# Patient Record
Sex: Female | Born: 1955
Health system: Southern US, Community
[De-identification: ages and names within clinical notes are randomized; demographics above are authoritative.]

## PROBLEM LIST (undated history)

## (undated) DIAGNOSIS — R739 Hyperglycemia, unspecified: Secondary | ICD-10-CM

## (undated) DIAGNOSIS — E785 Hyperlipidemia, unspecified: Secondary | ICD-10-CM

## (undated) DIAGNOSIS — M1990 Unspecified osteoarthritis, unspecified site: Secondary | ICD-10-CM

## (undated) DIAGNOSIS — M545 Low back pain, unspecified: Secondary | ICD-10-CM

## (undated) DIAGNOSIS — G8929 Other chronic pain: Secondary | ICD-10-CM

## (undated) DIAGNOSIS — I10 Essential (primary) hypertension: Secondary | ICD-10-CM

## (undated) DIAGNOSIS — B1920 Unspecified viral hepatitis C without hepatic coma: Secondary | ICD-10-CM

## (undated) DIAGNOSIS — F32A Depression, unspecified: Secondary | ICD-10-CM

## (undated) DIAGNOSIS — F419 Anxiety disorder, unspecified: Secondary | ICD-10-CM

## (undated) DIAGNOSIS — J449 Chronic obstructive pulmonary disease, unspecified: Secondary | ICD-10-CM

## (undated) DIAGNOSIS — F329 Major depressive disorder, single episode, unspecified: Secondary | ICD-10-CM

## (undated) HISTORY — DX: Depression, unspecified: F32.A

## (undated) HISTORY — DX: Chronic obstructive pulmonary disease, unspecified: J44.9

## (undated) HISTORY — DX: Hyperglycemia, unspecified: R73.9

## (undated) HISTORY — PX: OTHER SURGICAL HISTORY: SHX169

## (undated) HISTORY — DX: Hyperlipidemia, unspecified: E78.5

## (undated) HISTORY — DX: Unspecified viral hepatitis C without hepatic coma: B19.20

## (undated) HISTORY — DX: Low back pain, unspecified: M54.50

## (undated) HISTORY — DX: Major depressive disorder, single episode, unspecified: F32.9

## (undated) HISTORY — DX: Unspecified osteoarthritis, unspecified site: M19.90

## (undated) HISTORY — DX: Essential (primary) hypertension: I10

## (undated) HISTORY — DX: Other chronic pain: G89.29

## (undated) HISTORY — DX: Low back pain: M54.5

## (undated) HISTORY — DX: Anxiety disorder, unspecified: F41.9

---

## 1990-04-13 HISTORY — PX: OOPHORECTOMY: SHX86

## 2007-04-14 HISTORY — PX: OTHER SURGICAL HISTORY: SHX169

## 2008-04-13 HISTORY — PX: LIVER BIOPSY: SHX301

## 2009-01-10 ENCOUNTER — Ambulatory Visit: Payer: Self-pay | Admitting: Gastroenterology

## 2009-05-01 ENCOUNTER — Ambulatory Visit (HOSPITAL_COMMUNITY): Admission: RE | Admit: 2009-05-01 | Discharge: 2009-05-01 | Payer: Self-pay | Admitting: Gastroenterology

## 2009-05-14 LAB — HM MAMMOGRAPHY: HM Mammogram: NORMAL

## 2010-06-30 LAB — CBC
MCHC: 34.4 g/dL (ref 30.0–36.0)
Platelets: 280 10*3/uL (ref 150–400)
RDW: 12.9 % (ref 11.5–15.5)

## 2010-06-30 LAB — PROTIME-INR: Prothrombin Time: 12.9 seconds (ref 11.6–15.2)

## 2010-12-05 ENCOUNTER — Ambulatory Visit: Payer: Self-pay | Admitting: Family

## 2010-12-17 ENCOUNTER — Ambulatory Visit: Payer: Self-pay | Admitting: Family

## 2010-12-22 ENCOUNTER — Ambulatory Visit: Payer: Self-pay | Admitting: Family

## 2010-12-23 ENCOUNTER — Ambulatory Visit (INDEPENDENT_AMBULATORY_CARE_PROVIDER_SITE_OTHER): Payer: Medicare Other | Admitting: Family

## 2010-12-23 ENCOUNTER — Other Ambulatory Visit: Payer: Self-pay | Admitting: Family

## 2010-12-23 ENCOUNTER — Encounter: Payer: Self-pay | Admitting: Family

## 2010-12-23 DIAGNOSIS — F419 Anxiety disorder, unspecified: Secondary | ICD-10-CM

## 2010-12-23 DIAGNOSIS — Z720 Tobacco use: Secondary | ICD-10-CM

## 2010-12-23 DIAGNOSIS — J441 Chronic obstructive pulmonary disease with (acute) exacerbation: Secondary | ICD-10-CM

## 2010-12-23 DIAGNOSIS — F17200 Nicotine dependence, unspecified, uncomplicated: Secondary | ICD-10-CM

## 2010-12-23 DIAGNOSIS — J449 Chronic obstructive pulmonary disease, unspecified: Secondary | ICD-10-CM | POA: Insufficient documentation

## 2010-12-23 DIAGNOSIS — G8929 Other chronic pain: Secondary | ICD-10-CM

## 2010-12-23 DIAGNOSIS — B1920 Unspecified viral hepatitis C without hepatic coma: Secondary | ICD-10-CM

## 2010-12-23 DIAGNOSIS — I10 Essential (primary) hypertension: Secondary | ICD-10-CM

## 2010-12-23 DIAGNOSIS — R739 Hyperglycemia, unspecified: Secondary | ICD-10-CM

## 2010-12-23 DIAGNOSIS — M545 Low back pain: Secondary | ICD-10-CM

## 2010-12-23 DIAGNOSIS — F411 Generalized anxiety disorder: Secondary | ICD-10-CM

## 2010-12-23 DIAGNOSIS — R7309 Other abnormal glucose: Secondary | ICD-10-CM

## 2010-12-23 LAB — HEPATIC FUNCTION PANEL
AST: 10 U/L (ref 0–37)
Albumin: 4.3 g/dL (ref 3.5–5.2)
Alkaline Phosphatase: 112 U/L (ref 39–117)
Total Protein: 7.7 g/dL (ref 6.0–8.3)

## 2010-12-23 LAB — BASIC METABOLIC PANEL
CO2: 22 mEq/L (ref 19–32)
Calcium: 9.7 mg/dL (ref 8.4–10.5)
Creat: 0.84 mg/dL (ref 0.50–1.10)
Glucose, Bld: 135 mg/dL — ABNORMAL HIGH (ref 70–99)

## 2010-12-23 MED ORDER — NICOTINE 7 MG/24HR TD PT24: MEDICATED_PATCH | TRANSDERMAL | Status: DC

## 2010-12-23 MED ORDER — NICOTINE 14 MG/24HR TD PT24: MEDICATED_PATCH | TRANSDERMAL | Status: DC

## 2010-12-23 MED ORDER — BISOPROLOL-HYDROCHLOROTHIAZIDE 10-6.25 MG PO TABS: 1.0000 | ORAL_TABLET | Freq: Every day | ORAL | Status: DC

## 2010-12-23 MED ORDER — ALPRAZOLAM 1 MG PO TABS: 1.0000 mg | ORAL_TABLET | Freq: Three times a day (TID) | ORAL | Status: DC | PRN

## 2010-12-23 MED ORDER — TIOTROPIUM BROMIDE MONOHYDRATE 18 MCG IN CAPS: 18.0000 ug | ORAL_CAPSULE | Freq: Every day | RESPIRATORY_TRACT | Status: DC

## 2010-12-23 MED ORDER — ALBUTEROL SULFATE HFA 108 (90 BASE) MCG/ACT IN AERS: 2.0000 | INHALATION_SPRAY | Freq: Every day | RESPIRATORY_TRACT | Status: AC | PRN

## 2010-12-23 MED ORDER — BUDESONIDE-FORMOTEROL FUMARATE 160-4.5 MCG/ACT IN AERO: 2.0000 | INHALATION_SPRAY | Freq: Two times a day (BID) | RESPIRATORY_TRACT | Status: DC

## 2010-12-23 NOTE — Progress Notes (Signed)
Subjective:    Patient ID: Victoria Pratt, female    DOB: 05-Dec-1955, 55 y.o.   MRN: [id number]  HPI  Ms.  Stanke is a 55 yr old female here to establish care.  She has been following with Dr. Johnell Comings.  She is being followed by a pain clinic- [place] for back pain in her lower discs.    Today she reports that she fell in the bathtub 2 days ago.  Accidental slip. She went to the ED in [place] and was placed on zithromax and prednisone.  She is sore on her right ribs.    COPD- reports that she has been out of spiriva for 1 month.   Tobacco abuse- down form 1 PPD to 1/2 PPD.    Hepatitis C- reports hx of intranasal cocaine use  in the early 80's.  She reports that she had a liver biopsy 2 yrs ago.  She is seeing Dr. Norma Fredrickson GI with [facility].  Hyperlipidemia- She is on Lovaza only.   HTN- she reports that she is out of her BP med and does not know the name of the medication.   Depression-  Reports that this is sometimes a problem for her.  Anxiety- Alprazolam- she has been out of this for 2 weeks.  She has been on this for 4 years.  She has tried buspar in the past, but this makes her feel bad.  So she stopped.  Reports that she has also tried multiple other SRI's which she could not tolerate.    Back pain- seeing pain clinic-       Review of Systems  Constitutional: Negative for unexpected weight change.  HENT: Negative for hearing loss.   Eyes: Negative for visual disturbance.       Wears reading glasses  Respiratory: Positive for shortness of breath.   Cardiovascular: Negative for chest pain and leg swelling.  Gastrointestinal: Negative for abdominal pain.  Genitourinary: Positive for urgency.       Notes occasional urinary incontinence  Musculoskeletal: Positive for back pain and arthralgias.  Skin:       Bruising on her back from fall.   Psychiatric/Behavioral: The patient is nervous/anxious.    Past Medical History  Diagnosis Date  . Asthma   . Arthritis    . Depression   . Emphysema   . Hepatitis C   . Hyperlipidemia     History   Social History  . Marital Status: Single    Spouse Name: N/A    Number of Children: 4  . Years of Education: N/A   Occupational History  . Not on file.   Social History Main Topics  . Smoking status: Current Everyday Smoker -- 0.5 packs/day for 35 years    Types: Cigarettes  . Smokeless tobacco: Never Used  . Alcohol Use: No     remote history of cocaine use.    . Drug Use: No  . Sexually Active: Not on file   Other Topics Concern  . Not on file   Social History Narrative   Regular exercise:  Walks a lotCaffeine use: 2 cups coffee daily, soda and tea.She is on disability due to her COPD.Divorced4 children (2 daughters and 2 sons)    Past Surgical History  Procedure Date  . Head surgery 55 yrs old    had plate put in head  . Cesarean section     x 2  . Oophorectomy 1992    right ovary removed due to cyst  .  Liver biopsy 04/2008  . Bladder sling 2009    Family History  Problem Relation Age of Onset  . Arthritis Mother     rheumatoid  . Hypertension Mother   . Heart disease Father   . Arthritis Sister     rheumatoid  . Hypertension Sister   . Cancer Sister     ? in her leg ?    Allergies  Allergen Reactions  . Codeine Swelling    No current outpatient prescriptions on file prior to visit.    BP 152/98  Pulse 90  Temp(Src) 98 F (36.7 C) (Oral)  Resp 16  Wt 150 lb 1.9 oz (68.094 kg)  LMP 04/13/2002       Objective:   Physical Exam  Constitutional: She is oriented to person, place, and time. She appears well-developed and well-nourished.  HENT:  Head: Normocephalic and atraumatic.  Eyes: Conjunctivae are normal.  Cardiovascular: Normal rate and regular rhythm.   No murmur heard. Pulmonary/Chest: Effort normal. No respiratory distress. She has wheezes. She has no rales. She exhibits no tenderness.  Abdominal: Soft. Bowel sounds are normal. She exhibits no  distension and no mass. There is no guarding.  Musculoskeletal: She exhibits no edema.  Neurological: She is alert and oriented to person, place, and time.  Skin: Skin is warm and dry.  Psychiatric: She has a normal mood and affect. Her behavior is normal. Judgment and thought content normal.          Assessment & Plan:

## 2010-12-23 NOTE — Patient Instructions (Signed)
Follow up in 1 week.   Complete your lab work on the first floor today.

## 2010-12-23 NOTE — Assessment & Plan Note (Addendum)
Deteriorated. Resume Ziac.  Check BMET today.

## 2010-12-23 NOTE — Assessment & Plan Note (Addendum)
Follows with GI ([facility]), will request records. AST/ALT normal.

## 2010-12-23 NOTE — Assessment & Plan Note (Signed)
Refill was provided for alprazolam.  A controlled substance contract was also filled today.

## 2010-12-23 NOTE — Assessment & Plan Note (Signed)
She is instructed to complete the prednisone and z-pak provided to her in the ED.  Will also resume her spiriva and add Symbicort.

## 2010-12-23 NOTE — Assessment & Plan Note (Addendum)
Pt was counseled on smoking cessation for 5 minutes. She wishes to try the patch.  A prescription was provided to the patient.

## 2010-12-23 NOTE — Assessment & Plan Note (Signed)
This will be managed by the pain clinic.

## 2010-12-23 NOTE — Telephone Encounter (Signed)
Received request from pharmacy for quantity 90 of bisoprolol-hct. Request denied and sent note advising pharmacy to fill for quantity authorized (30).

## 2010-12-24 ENCOUNTER — Encounter: Payer: Self-pay | Admitting: Family

## 2010-12-24 DIAGNOSIS — R739 Hyperglycemia, unspecified: Secondary | ICD-10-CM | POA: Insufficient documentation

## 2010-12-24 NOTE — Assessment & Plan Note (Signed)
Random sugar 135.  Plan to check A1C next visit.

## 2010-12-26 ENCOUNTER — Telehealth: Payer: Self-pay | Admitting: Family

## 2010-12-26 MED ORDER — GABAPENTIN 800 MG PO TABS: 800.0000 mg | ORAL_TABLET | Freq: Four times a day (QID) | ORAL | Status: DC

## 2010-12-26 NOTE — Telephone Encounter (Signed)
Refill sent to pharmacy.   

## 2010-12-26 NOTE — Telephone Encounter (Signed)
Patient is requesting her lab results. Also, she is out of gabapentin and would like a refill called into [facility] on [address] in [place]

## 2010-12-26 NOTE — Telephone Encounter (Signed)
I will advise pt of results per lab letter. Please advise re: refill.

## 2010-12-26 NOTE — Telephone Encounter (Signed)
Notified pt that rx was completed and advised her of results per 12/24/10 letter. Pt states she was not fasting prior to her bloodwork. Advised pt additional testing would be discussed at her next visit.

## 2010-12-31 ENCOUNTER — Ambulatory Visit: Payer: Medicare Other | Admitting: Family

## 2011-01-05 ENCOUNTER — Ambulatory Visit: Payer: Medicare Other | Admitting: Family

## 2011-01-05 DIAGNOSIS — Z0289 Encounter for other administrative examinations: Secondary | ICD-10-CM

## 2011-01-06 ENCOUNTER — Ambulatory Visit (INDEPENDENT_AMBULATORY_CARE_PROVIDER_SITE_OTHER): Payer: Medicare Other | Admitting: Family

## 2011-01-06 ENCOUNTER — Encounter: Payer: Self-pay | Admitting: Family

## 2011-01-06 DIAGNOSIS — G8929 Other chronic pain: Secondary | ICD-10-CM

## 2011-01-06 DIAGNOSIS — J441 Chronic obstructive pulmonary disease with (acute) exacerbation: Secondary | ICD-10-CM

## 2011-01-06 DIAGNOSIS — M545 Low back pain: Secondary | ICD-10-CM

## 2011-01-06 NOTE — Assessment & Plan Note (Signed)
Improved, we again talked about the importance of quitting smoking.

## 2011-01-06 NOTE — Patient Instructions (Signed)
Continue your anti-inflammatory. You will be contacted about your MRI and your referral to pain management/neurosurgery.

## 2011-01-06 NOTE — Assessment & Plan Note (Addendum)
She brings with her today a letter on rx paper stating that she has been released from her pain contract by Dr. Marilynn Latino at the Pain clinic in [place].  I suspect that she has been discharged from the practice although she does not reveal this to me.  The pt has asked me for soma and narcotics which I told her that I will not provide her.  I have recommended that she perform an MRI of the lumbar spine to further evaluate as she has not had one since 2004.  Once I have these results I will refer her to pain management and neurosurgery.  At this point I recommended that she complete the rx anti-inflammatory that was prescribed by the ED and then switch to aleve. She became upset with this plan.  Later asked CMA if I would rx soma and I told her no.

## 2011-01-06 NOTE — Progress Notes (Signed)
Subjective:    Patient ID: Victoria Pratt, female    DOB: 12-04-1955, 55 y.o.   MRN: [id number]  HPI  She reports fall about 1 week ago.  Went to [facility] ED where she had an x-ray of her ribs/cxr. Was told that she did not break a rib but that she is bruised.    She slipped on a sponge in the bath tub- fell on her right side.  Right side is tender, worse with cough or movement. She reports that her son went to prison 3 weeks ago.  This is causing her a lot of stress. Pain on the right side- worse with cough. She reports that she was given an rx for an anti-inflammatory which she was given in the ED and she has not yet completed. She does not recall the name.   Tobacco abuse- she tried to fill nicoderm but could not was told she had to get over the counter.    COPD- she is taking spiriva and symbicort.    Chronic low back pain- she missed apt with pain clinic.  She would like to be referred to a pain clinic. She has not had an MRI since 2004.  She has some numbness in the right leg and reports that she has been "falling" a lot lately.    Review of Systems See HPI  Past Medical History  Diagnosis Date  . Asthma   . Arthritis   . Depression   . Emphysema   . Hepatitis C   . Hyperlipidemia     History   Social History  . Marital Status: Single    Spouse Name: N/A    Number of Children: 4  . Years of Education: N/A   Occupational History  . Not on file.   Social History Main Topics  . Smoking status: Current Everyday Smoker -- 0.5 packs/day for 35 years    Types: Cigarettes  . Smokeless tobacco: Never Used  . Alcohol Use: No     remote history of cocaine use.    . Drug Use: No  . Sexually Active: Not on file   Other Topics Concern  . Not on file   Social History Narrative   Regular exercise:  Walks a lotCaffeine use: 2 cups coffee daily, soda and tea.She is on disability due to her COPD.Divorced4 children (2 daughters and 2 sons)    Past Surgical  History  Procedure Date  . Head surgery 55 yrs old    had plate put in head  . Cesarean section     x 2  . Oophorectomy 1992    right ovary removed due to cyst  . Liver biopsy 04/2008  . Bladder sling 2009    Family History  Problem Relation Age of Onset  . Arthritis Mother     rheumatoid  . Hypertension Mother   . Heart disease Father   . Arthritis Sister     rheumatoid  . Hypertension Sister   . Cancer Sister     ? in her leg ?    Allergies  Allergen Reactions  . Codeine Swelling    Current Outpatient Prescriptions on File Prior to Visit  Medication Sig Dispense Refill  . albuterol (PROVENTIL HFA;VENTOLIN HFA) 108 (90 BASE) MCG/ACT inhaler Inhale 2 puffs into the lungs daily as needed.  1 Inhaler  2  . ALPRAZolam (XANAX) 1 MG tablet Take 1 tablet (1 mg total) by mouth 3 (three) times daily as needed.  90 tablet  0  . bisoprolol-hydrochlorothiazide (ZIAC) 10-6.25 MG per tablet TAKE 1 TABLET BY MOUTH DAILY  30 tablet  0  . budesonide-formoterol (SYMBICORT) 160-4.5 MCG/ACT inhaler Inhale 2 puffs into the lungs 2 (two) times daily.  1 Inhaler  3  . gabapentin (NEURONTIN) 800 MG tablet Take 1 tablet (800 mg total) by mouth 4 (four) times daily.  120 tablet  2  . omega-3 acid ethyl esters (LOVAZA) 1 G capsule Take 4 capsules daily.       Marland Kitchen omeprazole (PRILOSEC) 40 MG capsule Take 40 mg by mouth daily.        Marland Kitchen tiotropium (SPIRIVA) 18 MCG inhalation capsule Place 1 capsule (18 mcg total) into inhaler and inhale daily.  30 capsule  2  . nicotine (NICODERM CQ - DOSED IN MG/24 HOURS) 14 mg/24hr patch For 6 weeks.  45 patch  0  . nicotine (NICODERM CQ - DOSED IN MG/24 HR) 7 mg/24hr patch On weeks 7 and 8 then stop  14 patch  0    BP 104/78  Pulse 66  Temp(Src) 97.7 F (36.5 C) (Oral)  Resp 16  Wt 153 lb 0.6 oz (69.418 kg)  LMP 04/13/2002       Objective:   Physical Exam  Constitutional: She appears well-developed and well-nourished.       Appears uncomfortable with  cough.  HENT:  Head: Normocephalic and atraumatic.  Cardiovascular: Normal rate and regular rhythm.   No murmur heard. Pulmonary/Chest: No respiratory distress.       Few scattered rhonchi/wheezes.  Musculoskeletal:       No bruising noted on right flank/ribs.  Neurological:  Reflex Scores:      Patellar reflexes are 2+ on the right side and 2+ on the left side.      Achilles reflexes are 2+ on the right side and 2+ on the left side.      RLE 4-5/5 strength, LLE 5/5 strength, steady even gait.  Skin: She is not diaphoretic.          Assessment & Plan:

## 2011-01-07 ENCOUNTER — Telehealth: Payer: Self-pay | Admitting: *Deleted

## 2011-01-07 DIAGNOSIS — M545 Low back pain: Secondary | ICD-10-CM

## 2011-01-07 NOTE — Telephone Encounter (Signed)
I will order it within the [facility].  They can cancel their order. Pls notify pt that I will order it to be [facility] at [facility].

## 2011-01-07 NOTE — Telephone Encounter (Signed)
Received call from Norman, RN in radiology at [facility]. She states she contacted pt about an MRI of L-spine and hip that was ordered by Dr Marilynn Latino (pain management). She reports that pt stated she was being seen by Sandford Craze and [facility]. Lynden Ang is wanting to know if the MRI still needs to be ordered and if so, who should the ordering provider be?

## 2011-01-08 ENCOUNTER — Telehealth: Payer: Self-pay | Admitting: *Deleted

## 2011-01-08 ENCOUNTER — Telehealth: Payer: Self-pay | Admitting: Family

## 2011-01-08 NOTE — Telephone Encounter (Signed)
Pt left message that she was contacted re: MRI for Saturday. Wanted to remind [place] that she has a metal plate in her head from an accident when she was a child and wants to know if she can still have an MRI? Please advise.

## 2011-01-08 NOTE — Telephone Encounter (Signed)
Received records from Dr Excell Seltzer [facility]) and forwarded to Provider for review.

## 2011-01-08 NOTE — Telephone Encounter (Signed)
Left message on machine to return my call. 

## 2011-01-08 NOTE — Telephone Encounter (Signed)
Opened in error

## 2011-01-08 NOTE — Telephone Encounter (Signed)
Received records from [facility] (Dr Norma Fredrickson) and forwarded to Provider for review.

## 2011-01-08 NOTE — Telephone Encounter (Signed)
No she cannot have an MRI with metal plate.  Lets cancel it.

## 2011-01-09 NOTE — Telephone Encounter (Signed)
Left message for patient to return my call.

## 2011-01-09 NOTE — Telephone Encounter (Signed)
Notified pt that MRI for Saturday has been cancelled. She states she has spoken with Melissa and is aware.

## 2011-01-09 NOTE — Telephone Encounter (Signed)
Notified Victoria Pratt in imaging. She states they can do skull xrays first tomorrow before the MRI if we need to verify placement of steele plate as previous head CT did not indicate one was in place. Please advise.

## 2011-01-10 ENCOUNTER — Ambulatory Visit (HOSPITAL_BASED_OUTPATIENT_CLINIC_OR_DEPARTMENT_OTHER): Payer: Medicare Other

## 2011-01-14 NOTE — Telephone Encounter (Signed)
Addended by: Sandford Craze on: 01/14/2011 12:44 PM   Modules accepted: Orders

## 2011-01-14 NOTE — Telephone Encounter (Signed)
Pls call pt and let her know that I have placed order for Skull x-rays to evaluate for metal plate.  Once I have received these results, I will order MRI.  She should call imaging dept to arrange apt for x-ray.

## 2011-01-14 NOTE — Telephone Encounter (Signed)
Pt notified, number provided.

## 2011-01-15 ENCOUNTER — Telehealth: Payer: Self-pay | Admitting: *Deleted

## 2011-01-15 NOTE — Telephone Encounter (Signed)
Received message from pt that she took her Neurontin to her boyfriend's house because she has teenagers staying with her and her medication is now missing. Her pharmacist told her it is not good to stop Neurontin suddenly and pt is requesting a refill on medication now. Last refill was 12/26/10. Please advise.

## 2011-01-16 MED ORDER — GABAPENTIN 800 MG PO TABS: 800.0000 mg | ORAL_TABLET | Freq: Four times a day (QID) | ORAL | Status: DC

## 2011-01-16 NOTE — Telephone Encounter (Signed)
Spoke to pharmacist. He states rx on 12/26/10 was filled for 3 month supply as he combined the 2 refills at that time. No refills are remaining on Rx.  Per verbal from [place], ok to give 30 day supply only. New rx sent to pharmacy.

## 2011-01-16 NOTE — Telephone Encounter (Signed)
There is a refill on her neurontin- though I am not sure that medicare will pay for it early. She can try.

## 2011-01-19 ENCOUNTER — Ambulatory Visit (HOSPITAL_BASED_OUTPATIENT_CLINIC_OR_DEPARTMENT_OTHER)
Admission: RE | Admit: 2011-01-19 | Discharge: 2011-01-19 | Disposition: A | Discharge status: Home / Self Care | Payer: Medicare Other | Source: Ambulatory Visit | Attending: Family | Admitting: Family

## 2011-01-19 DIAGNOSIS — Z0189 Encounter for other specified special examinations: Secondary | ICD-10-CM | POA: Insufficient documentation

## 2011-01-19 DIAGNOSIS — M545 Low back pain: Secondary | ICD-10-CM

## 2011-01-19 DIAGNOSIS — Z0389 Encounter for observation for other suspected diseases and conditions ruled out: Secondary | ICD-10-CM

## 2011-01-21 ENCOUNTER — Other Ambulatory Visit: Payer: Self-pay | Admitting: Family

## 2011-01-22 ENCOUNTER — Telehealth: Payer: Self-pay | Admitting: *Deleted

## 2011-01-22 ENCOUNTER — Telehealth: Payer: Self-pay | Admitting: Family

## 2011-01-22 NOTE — Telephone Encounter (Signed)
Received call from [facility] in Imaging stating pt is requesting medication to help with her claustrophobia for her open MRI on Tuesday morning. Please advise.

## 2011-01-22 NOTE — Telephone Encounter (Signed)
I recommend that she take 1 mg of xanax 15 minutes prior to her mri. She already has rx for this.

## 2011-01-22 NOTE — Telephone Encounter (Signed)
Received records from Dr Johnell Comings and forwarded to provider for review.

## 2011-01-22 NOTE — Telephone Encounter (Signed)
Left message on machine to return my call. 

## 2011-01-23 ENCOUNTER — Telehealth: Payer: Self-pay | Admitting: Family

## 2011-01-23 DIAGNOSIS — G4730 Sleep apnea, unspecified: Secondary | ICD-10-CM | POA: Insufficient documentation

## 2011-01-23 NOTE — Telephone Encounter (Signed)
Left message on machine to return my call. 

## 2011-01-23 NOTE — Telephone Encounter (Signed)
Pls call pt and let her know that I received a request for CPAP order from home health.  I do not have any records on her sleep apnea.  Please ask her the following questions: who has been prescribing her CPAP, what are her settings and where/when did she have last sleep study.  I would like for her to sign a medical release for her sleep study results please.

## 2011-01-23 NOTE — Telephone Encounter (Signed)
Notiifed pt. She states she doesn't think 1mg  of Xanax is going to be strong enough. She states she has a lot of difficulty lying on her back and states that she cannot lay still if she is on her back, has a lot of discomfort. Advised pt to continue with Xanax as advised by Melissa. Are there any other recommendations for pt?

## 2011-01-23 NOTE — Telephone Encounter (Signed)
I recommend that she take a tablet of aleve 1 hr prior to the procedure.  The 1mg  of xanax will be in addition to her standing dose.

## 2011-01-23 NOTE — Telephone Encounter (Signed)
Call placed to patient at [phone] , she stated she was seeing a Dr Judge Stall out of [place] , and her last sleep study was ordered by Dr. Lodema Pilot with [facility]. She stated the test was performed last year. Patient was advised per Melissa to sign medical release  to obtain sleep study results. She stated she is scheduled for MRI on Tuesday of next week and she will sign the release at that time.

## 2011-01-26 NOTE — Telephone Encounter (Signed)
Call placed to patient at [phone], no answer. A detailed voice message was left informing patient per [facility]'s instructions. Message was left informing patient to call back if any questions.

## 2011-01-27 ENCOUNTER — Other Ambulatory Visit (HOSPITAL_BASED_OUTPATIENT_CLINIC_OR_DEPARTMENT_OTHER): Payer: Medicare Other

## 2011-01-28 ENCOUNTER — Telehealth: Payer: Self-pay | Admitting: *Deleted

## 2011-01-28 DIAGNOSIS — G4733 Obstructive sleep apnea (adult) (pediatric): Secondary | ICD-10-CM

## 2011-01-29 NOTE — Telephone Encounter (Signed)
Received fax requesting rx for CPAP machine and supplies. Spoke to pt and verified that she has been using a CPAP machine but it recently broke and she is needing a replacement. Advised pt she will need to sign a records release for [place] to get previous sleep study before we can order a machine and supplies for her. Pt states she will come by this week to sign release.  Left message on Kristina's voicemail at [employer] [phone] re: status of request and to call if any questions.

## 2011-01-31 ENCOUNTER — Ambulatory Visit (HOSPITAL_BASED_OUTPATIENT_CLINIC_OR_DEPARTMENT_OTHER)
Admission: RE | Admit: 2011-01-31 | Discharge: 2011-01-31 | Disposition: A | Discharge status: Home / Self Care | Payer: Medicare Other | Source: Ambulatory Visit | Attending: Family | Admitting: Family

## 2011-01-31 DIAGNOSIS — M48061 Spinal stenosis, lumbar region without neurogenic claudication: Secondary | ICD-10-CM | POA: Insufficient documentation

## 2011-01-31 DIAGNOSIS — M545 Low back pain: Secondary | ICD-10-CM

## 2011-01-31 DIAGNOSIS — M25559 Pain in unspecified hip: Secondary | ICD-10-CM | POA: Insufficient documentation

## 2011-02-02 ENCOUNTER — Other Ambulatory Visit: Payer: Self-pay | Admitting: Family

## 2011-02-02 NOTE — Telephone Encounter (Signed)
Refill left on pharmacy voicemail for alprazolam 1mg 1 tablet three times daily #90 x no refills. 

## 2011-02-03 ENCOUNTER — Telehealth: Payer: Self-pay | Admitting: Family

## 2011-02-03 DIAGNOSIS — M545 Low back pain: Secondary | ICD-10-CM

## 2011-02-03 NOTE — Telephone Encounter (Signed)
Reviewed MRI results with patient.  Recommended referral to Neurosurgery. She is agreeable. Also, offered referral to pain management but she declines at this time. She asked if I would send med for pain and I declined and told her that this would need to come from pain management.

## 2011-02-04 ENCOUNTER — Telehealth: Payer: Self-pay | Admitting: *Deleted

## 2011-02-04 NOTE — Telephone Encounter (Signed)
Received fax from [facility] on [address] [place] requesting order for Shingles vaccine to be given to pt at their pharmacy. Form forwarded to provider for review. Please advise.

## 2011-02-04 NOTE — Telephone Encounter (Signed)
Signed rx for zostavax.

## 2011-02-06 ENCOUNTER — Encounter: Payer: Self-pay | Admitting: Family

## 2011-02-06 ENCOUNTER — Ambulatory Visit (INDEPENDENT_AMBULATORY_CARE_PROVIDER_SITE_OTHER): Payer: Medicare Other | Admitting: Family

## 2011-02-06 DIAGNOSIS — G4730 Sleep apnea, unspecified: Secondary | ICD-10-CM

## 2011-02-06 DIAGNOSIS — J441 Chronic obstructive pulmonary disease with (acute) exacerbation: Secondary | ICD-10-CM

## 2011-02-06 DIAGNOSIS — J309 Allergic rhinitis, unspecified: Secondary | ICD-10-CM | POA: Insufficient documentation

## 2011-02-06 DIAGNOSIS — M545 Low back pain: Secondary | ICD-10-CM

## 2011-02-06 DIAGNOSIS — B370 Candidal stomatitis: Secondary | ICD-10-CM

## 2011-02-06 DIAGNOSIS — G8929 Other chronic pain: Secondary | ICD-10-CM

## 2011-02-06 MED ORDER — GABAPENTIN 800 MG PO TABS: 800.0000 mg | ORAL_TABLET | Freq: Four times a day (QID) | ORAL | Status: DC

## 2011-02-06 MED ORDER — NYSTATIN 100000 UNIT/ML MT SUSP: 500000.0000 [IU] | Freq: Four times a day (QID) | OROMUCOSAL | Status: AC

## 2011-02-06 MED ORDER — METHYLPREDNISOLONE 4 MG PO KIT: PACK | ORAL | Status: AC

## 2011-02-06 NOTE — Assessment & Plan Note (Signed)
Records requested re: sleep study.

## 2011-02-06 NOTE — Patient Instructions (Signed)
Please keep your upcoming apt with Neurosurgery. You will be contacted about your referral to pain management.  Follow up in 3 months.

## 2011-02-06 NOTE — Progress Notes (Signed)
Subjective:    Patient ID: Victoria Pratt, female    DOB: 1955-08-20, 55 y.o.   MRN: [id number]  HPI  [name] is a 55 yr old female who presents today for follow up.  COPD- She reports that she has had some hoarseness the last few days.  She does report some allergy symptoms (rhinorrhea).  Also reports that her breathing has been "bad."  Back Pain- She has an appointment with Neurosurgery to further evaluate her low back pain.    Sleep apnea- She reports that she used to wear a CPAP but it broke.  She has been without x 8 months.  She had a sleep study back in 2006.  Dr. Auglaize Lions ordered this.  She reports + daytime somnolence.  She tells me that she signed a medical release so that we could request a copy of her previous sleep study.  Tobacco abuse- she reports that the drug store will not fill.       Review of Systems See HPI  Past Medical History  Diagnosis Date  . Asthma   . Arthritis   . Depression   . Emphysema   . Hepatitis C   . Hyperlipidemia     History   Social History  . Marital Status: Single    Spouse Name: N/A    Number of Children: 4  . Years of Education: N/A   Occupational History  . Not on file.   Social History Main Topics  . Smoking status: Current Everyday Smoker -- 0.5 packs/day for 35 years    Types: Cigarettes  . Smokeless tobacco: Never Used  . Alcohol Use: No     remote history of cocaine use.    . Drug Use: No  . Sexually Active: Not on file   Other Topics Concern  . Not on file   Social History Narrative   Regular exercise:  Walks a lotCaffeine use: 2 cups coffee daily, soda and tea.She is on disability due to her COPD.Divorced4 children (2 daughters and 2 sons)    Past Surgical History  Procedure Date  . Head surgery 55 yrs old    had plate put in head  . Cesarean section     x 2  . Oophorectomy 1992    right ovary removed due to cyst  . Liver biopsy 04/2008  . Bladder sling 2009    Family History  Problem Relation  Age of Onset  . Arthritis Mother     rheumatoid  . Hypertension Mother   . Heart disease Father   . Arthritis Sister     rheumatoid  . Hypertension Sister   . Cancer Sister     ? in her leg ?    Allergies  Allergen Reactions  . Codeine Swelling    Current Outpatient Prescriptions on File Prior to Visit  Medication Sig Dispense Refill  . albuterol (PROVENTIL HFA;VENTOLIN HFA) 108 (90 BASE) MCG/ACT inhaler Inhale 2 puffs into the lungs daily as needed.  1 Inhaler  2  . ALPRAZolam (XANAX) 1 MG tablet TAKE 1 TABLET BY MOUTH THREE TIMES DAILY AS NEEDED  90 tablet  0  . bisoprolol-hydrochlorothiazide (ZIAC) 10-6.25 MG per tablet TAKE 1 TABLET BY MOUTH DAILY  30 tablet  0  . budesonide-formoterol (SYMBICORT) 160-4.5 MCG/ACT inhaler Inhale 2 puffs into the lungs 2 (two) times daily.  1 Inhaler  3  . omega-3 acid ethyl esters (LOVAZA) 1 G capsule Take 4 capsules daily.       Marland Kitchen  omeprazole (PRILOSEC) 40 MG capsule Take 40 mg by mouth daily.        Marland Kitchen tiotropium (SPIRIVA) 18 MCG inhalation capsule Place 1 capsule (18 mcg total) into inhaler and inhale daily.  30 capsule  2  . nicotine (NICODERM CQ - DOSED IN MG/24 HOURS) 14 mg/24hr patch For 6 weeks.  45 patch  0  . nicotine (NICODERM CQ - DOSED IN MG/24 HR) 7 mg/24hr patch On weeks 7 and 8 then stop  14 patch  0    BP 108/78  Pulse 78  Temp(Src) 97.8 F (36.6 C) (Oral)  Resp 16  Wt 154 lb 1.3 oz (69.89 kg)       Objective:   Physical Exam  Constitutional: She appears well-developed and well-nourished. No distress.  HENT:  Head: Normocephalic and atraumatic.       Mild erythema is noted.   Cardiovascular: Normal rate and regular rhythm.   No murmur heard. Pulmonary/Chest:       Bilateral expiratory wheeze without increased work of breathing.   Psychiatric: She has a normal mood and affect. Her speech is normal and behavior is normal. Thought content normal.          Assessment & Plan:

## 2011-02-06 NOTE — Assessment & Plan Note (Signed)
Suspect mild thrush as a result of symbicort. Will plan to treat with nystatin suspension.

## 2011-02-06 NOTE — Assessment & Plan Note (Signed)
This may also be contributing to hoarseness.  Add claritin trial.

## 2011-02-06 NOTE — Assessment & Plan Note (Signed)
Deteriorated.  We discussed the importance of smoking cessation. She has not yet started the patch due to cost.  Will plan to treat with steroid taper.

## 2011-02-06 NOTE — Assessment & Plan Note (Addendum)
MRI showed: spinal, bilateral lateral recess and left foraminal stenosis at L4-5 due to a disc protrusion, bulging annulus, short pedicles and advanced facet disease. She is requesting something for pain.  I offered her a medrol dose pak and referral to Pain Management.  She already has an appointment scheduled with Neurosurgery.

## 2011-02-10 NOTE — Telephone Encounter (Signed)
Pt signed records release to obtain previous sleep study from [facility] and it was faxed on 02/06/11.

## 2011-02-13 ENCOUNTER — Telehealth: Payer: Self-pay | Admitting: Family

## 2011-02-13 NOTE — Telephone Encounter (Signed)
Yes please

## 2011-02-13 NOTE — Telephone Encounter (Signed)
Call from  [facility]  --  Referral was sent for chronic low back pain  , pt has been discharged from their practice,   She has a referral for neurosurgery  For low back pain, do you want me to try another pain management

## 2011-02-16 ENCOUNTER — Telehealth: Payer: Self-pay | Admitting: Family

## 2011-02-16 NOTE — Telephone Encounter (Signed)
Yes please

## 2011-02-16 NOTE — Telephone Encounter (Signed)
Referral to [facility]   Pt was discharged from their office  , do you want me to try [facility]

## 2011-02-18 NOTE — Telephone Encounter (Addendum)
No meds for back spasms.  I recommend a heating pad prn (but don't sleep with it). We are working on a pain management referral for her but unfortunately the two places we have tried so far have refused to see her stating that she has been discharged from their practice.  We are waiting to hear back from a third pain management group. Also, there was no information re: her sleep apnea in the records from [facility].  I would like for her to see Dr. Vassie Loll, Pulmonary to help with her sleep apnea management.

## 2011-02-18 NOTE — Telephone Encounter (Signed)
Records received from [facility] and forwarded to Provider for review. Received call from pt stating [facility] recommended that she try a back brace as pt states she is going to need surgery.  I advised pt to contact [facility] to correct request and send to [facility] as they are the ordering Provider.  Pt states [facility] would not prescribe her anything for her back spasms and pt is requesting med. Please advise.

## 2011-02-18 NOTE — Telephone Encounter (Signed)
Received fax from [facility] requesting signature for pt to receive a knee orthosis. Left message for pt to return my call. Need clarification as to why a knee orthosis is needed as I do not see previous documentation supporting need.

## 2011-02-19 NOTE — Telephone Encounter (Signed)
Pt advised and will proceed with referral to Dr Vassie Loll.

## 2011-03-01 ENCOUNTER — Other Ambulatory Visit: Payer: Self-pay | Admitting: Family

## 2011-03-02 ENCOUNTER — Other Ambulatory Visit: Payer: Self-pay | Admitting: Family

## 2011-03-02 NOTE — Telephone Encounter (Signed)
See previous refill noted.

## 2011-03-02 NOTE — Telephone Encounter (Signed)
Refill left on pharmacy voicemail for alprazolam 1mg  1 tablet three times daily #90 x no refills.

## 2011-03-03 ENCOUNTER — Telehealth: Payer: Self-pay | Admitting: *Deleted

## 2011-03-03 ENCOUNTER — Other Ambulatory Visit: Payer: Self-pay | Admitting: Family

## 2011-03-03 NOTE — Telephone Encounter (Signed)
Received message from pt stating she will be having back surgery in 2 weeks. States that she used to be on oxygen at night and has been off of it for some time. Feels like she may need to go back on it. Advised pt she will need to be seen in the office to determine need for O2 at night. Scheduled appt with Sandford Craze, NP for 03/10/11 at 10:45 am. Pt wanted to know status of CPAP order. Reminded pt that she was being referred to Dr Vassie Loll for assessment and management of CPAP and has appt with him on 03/31/11. Pt voices understanding.

## 2011-03-10 ENCOUNTER — Ambulatory Visit: Payer: Medicare Other | Admitting: Family

## 2011-03-17 ENCOUNTER — Institutional Professional Consult (permissible substitution): Payer: Medicare Other | Admitting: Pulmonary Disease

## 2011-03-18 ENCOUNTER — Ambulatory Visit (INDEPENDENT_AMBULATORY_CARE_PROVIDER_SITE_OTHER): Payer: Medicare Other | Admitting: Family

## 2011-03-18 ENCOUNTER — Encounter: Payer: Self-pay | Admitting: Family

## 2011-03-18 VITALS — BP 158/82 | HR 78 | Temp 98.0°F | Resp 16 | Wt 158.0 lb

## 2011-03-18 DIAGNOSIS — L259 Unspecified contact dermatitis, unspecified cause: Secondary | ICD-10-CM

## 2011-03-18 DIAGNOSIS — J441 Chronic obstructive pulmonary disease with (acute) exacerbation: Secondary | ICD-10-CM

## 2011-03-18 DIAGNOSIS — L309 Dermatitis, unspecified: Secondary | ICD-10-CM | POA: Insufficient documentation

## 2011-03-18 DIAGNOSIS — R252 Cramp and spasm: Secondary | ICD-10-CM

## 2011-03-18 DIAGNOSIS — I10 Essential (primary) hypertension: Secondary | ICD-10-CM

## 2011-03-18 LAB — BASIC METABOLIC PANEL
BUN: 9 mg/dL (ref 6–23)
Calcium: 9.6 mg/dL (ref 8.4–10.5)
Glucose, Bld: 101 mg/dL — ABNORMAL HIGH (ref 70–99)

## 2011-03-18 MED ORDER — ROFLUMILAST 500 MCG PO TABS: 500.0000 ug | ORAL_TABLET | Freq: Every day | ORAL | Status: DC

## 2011-03-18 MED ORDER — PREDNISONE 10 MG PO TABS: ORAL_TABLET | ORAL | Status: DC

## 2011-03-18 NOTE — Assessment & Plan Note (Signed)
Recommended moisturizers and hydrocortisone as outlined in avs.

## 2011-03-18 NOTE — Assessment & Plan Note (Signed)
Check bmet 

## 2011-03-18 NOTE — Patient Instructions (Addendum)
Apply lubriderm generously daily.  Try to shower every other day.   Apply OTC hydrocortisone cream twice daily to affected areas as needed.  Complete your blood work prior to leaving.  Keep your upcoming pulmonary appointment.

## 2011-03-18 NOTE — Progress Notes (Signed)
  Subjective:    Patient ID: Victoria Pratt, female    DOB: Sep 09, 1955, 55 y.o.   MRN: [id number]  HPI  COPD- she reports that  She has had an order for home oxygen over a year ago. She is requesting   Rash reports that she is "broke out" on her abdomen.  Notes + itching.  Started 2 weeks ago.  On back and behind her knees.    Notes abdominal cramping in her side and sometimes in the back of her legs.  +toe cramping.  She reports that she drinks lots of water.   Review of Systems See hpi      Objective:   Physical Exam  Constitutional: She appears well-developed and well-nourished.  HENT:  Head: Normocephalic and atraumatic.  Cardiovascular: Normal rate and regular rhythm.   No murmur heard. Pulmonary/Chest: Effort normal and breath sounds normal. No respiratory distress. She has no wheezes. She has no rales. She exhibits no tenderness.  Abdominal: Soft. Bowel sounds are normal. She exhibits no distension. There is no tenderness. There is no rebound and no guarding.  Musculoskeletal: She exhibits no edema.  Skin:       Very dry and excoriated especially on abdomen.          Assessment & Plan:

## 2011-03-18 NOTE — Assessment & Plan Note (Signed)
Deteriorated. She stopped symbicort due to thrush. Will treat with prednisone taper and add Daliresp. She is scheduled to see pulmonary. Pt was ambulated today and her room air sat was stable at 96 percent.  I will defer oxygen and cpap orders to pulmonary.

## 2011-03-20 ENCOUNTER — Ambulatory Visit: Payer: Medicare Other | Admitting: Physical Medicine & Rehabilitation

## 2011-03-20 ENCOUNTER — Encounter: Payer: Self-pay | Admitting: Family

## 2011-03-20 ENCOUNTER — Encounter: Payer: Medicare Other | Attending: Physical Medicine & Rehabilitation

## 2011-03-26 ENCOUNTER — Other Ambulatory Visit: Payer: Self-pay | Admitting: Family

## 2011-03-29 ENCOUNTER — Other Ambulatory Visit: Payer: Self-pay | Admitting: Family

## 2011-03-30 ENCOUNTER — Other Ambulatory Visit: Payer: Self-pay | Admitting: Family

## 2011-03-30 NOTE — Telephone Encounter (Signed)
Refill left on pharmacy voicemail for alprazolam 1 mg 1 tablet three times daily as needed. #90 x no refills.

## 2011-03-31 ENCOUNTER — Institutional Professional Consult (permissible substitution): Payer: Medicare Other | Admitting: Pulmonary Disease

## 2011-04-16 ENCOUNTER — Other Ambulatory Visit: Payer: Self-pay | Admitting: Family

## 2011-04-17 NOTE — Telephone Encounter (Signed)
Refill previously sent to pharmacy on 03/16/11 for bisoprolol x 2 refills. Should still have refills on file. No previous record of zanaflex on file for pt. Notified Kathlene November at [facility] that requests have been denied. Spoke to pt and advised her that she should still have bisoprolol refills at [facility] and she should contact [facility] regarding Zanaflex Rx. Pt voices understanding.

## 2011-04-20 ENCOUNTER — Ambulatory Visit: Payer: Medicare Other | Admitting: Physical Medicine & Rehabilitation

## 2011-04-20 ENCOUNTER — Encounter: Payer: Medicare Other | Attending: Physical Medicine & Rehabilitation

## 2011-04-23 ENCOUNTER — Telehealth: Payer: Self-pay | Admitting: *Deleted

## 2011-04-23 NOTE — Telephone Encounter (Signed)
No early refill on xanax.  I would recommend that she use over the counter benadryl for itching.

## 2011-04-23 NOTE — Telephone Encounter (Signed)
Received call from pt stating she went to ER twice for an itching rash from head to toe. States both ERs told her it was eczema and stress reaction.  Records release has been faxed to [facility] and [facility]. Pt requesting refill of vistaril that the ER gave her. States she has no transportation to the office as boyfriend is out of town and is unable to obtain other means.  Also, pt states she dropped the rest of her xanax down the drain and is requesting early refill. Per pharmacist, rx last filled on 03/30/11. Please advise.

## 2011-04-24 NOTE — Telephone Encounter (Signed)
Left detailed message on home phone and to call if any questions. 

## 2011-04-27 ENCOUNTER — Other Ambulatory Visit: Payer: Self-pay | Admitting: Family

## 2011-04-27 ENCOUNTER — Other Ambulatory Visit: Payer: Self-pay | Admitting: Neurosurgery

## 2011-04-27 NOTE — Telephone Encounter (Signed)
Refill of Xanax 1mg  1 tablet three times daily #90 x no refills left on pharmacy voicemail. Last refill was 03/29/11.

## 2011-04-29 ENCOUNTER — Encounter (HOSPITAL_COMMUNITY): Payer: Self-pay | Admitting: Pharmacy Technician

## 2011-05-05 ENCOUNTER — Ambulatory Visit: Payer: Medicare Other | Admitting: Family

## 2011-05-05 DIAGNOSIS — Z0289 Encounter for other administrative examinations: Secondary | ICD-10-CM

## 2011-05-09 ENCOUNTER — Other Ambulatory Visit: Payer: Self-pay | Admitting: Family

## 2011-05-12 ENCOUNTER — Ambulatory Visit: Payer: Medicare Other | Admitting: Family

## 2011-05-12 ENCOUNTER — Institutional Professional Consult (permissible substitution): Payer: Medicare Other | Admitting: Pulmonary Disease

## 2011-05-13 ENCOUNTER — Telehealth: Payer: Self-pay | Admitting: *Deleted

## 2011-05-13 NOTE — Telephone Encounter (Signed)
Received fax from [place] at [facility] [phone] Ext [phone] requesting order for CPAP machine and supplies. Advised her again that we will not be completing order as pt has been referred to pulmonology. Pt still needs to keep appt with pulmonology and pt was notified of this previously. Foye Clock will stop faxing [place] the request and contact the pt.

## 2011-05-14 ENCOUNTER — Ambulatory Visit: Payer: Medicare Other | Admitting: Physical Medicine & Rehabilitation

## 2011-05-15 ENCOUNTER — Inpatient Hospital Stay (HOSPITAL_COMMUNITY): Admission: RE | Admit: 2011-05-15 | Payer: Medicare Other | Source: Ambulatory Visit | Admitting: Neurosurgery

## 2011-05-15 ENCOUNTER — Encounter (HOSPITAL_COMMUNITY): Admission: RE | Payer: Self-pay | Source: Ambulatory Visit

## 2011-05-15 SURGERY — POSTERIOR LUMBAR FUSION 2 LEVEL
Anesthesia: General

## 2011-05-19 ENCOUNTER — Other Ambulatory Visit: Payer: Self-pay | Admitting: Neurosurgery

## 2011-05-21 ENCOUNTER — Other Ambulatory Visit: Payer: Self-pay | Admitting: Family

## 2011-05-21 NOTE — Telephone Encounter (Signed)
Refills left on pharmacy voicemail for alprazolam 1mg   Take 1 tablet by mouth three times daily as needed. #90 x no refills and Rx CANNOT be filled before 05/28/11 as last refill was 04/28/11. Ziac #30 x 1 refill.

## 2011-06-08 ENCOUNTER — Inpatient Hospital Stay (HOSPITAL_COMMUNITY): Admission: RE | Admit: 2011-06-08 | Discharge: 2011-06-08 | Payer: Medicare Other | Source: Ambulatory Visit

## 2011-06-08 NOTE — Pre-Procedure Instructions (Addendum)
21 Victoria Pratt   06/08/2011           Your procedure is scheduled on Friday, March 1st.  Report to [facility] at 5:30am.   Call this number if you have problems the morning of surgery: [phone]   Remember:   Do not eat food:After Midnight.  May have clear liquids: up to 4 Hours before arrival.  Clear liquids include soda, tea, black coffee, apple or grape juice, broth.  Take these medicines the morning of surgery with A SIP OF WATER: Xanax, Gabapentin, Omeprazole, Daliresp.  Use Symbicort, Spriva and Albuterol Inhalers  Discontinune Aspirin, Coumadin, Plavix, Effient and Herbal Medications.  Do not wear jewelry, make-up or nail polish.  Do not wear lotions, powders, or perfumes. You may wear deodorant.  Do not shave 48 hours prior to surgery.  Do not bring valuables to the hospital.  Contacts, dentures or bridgework may not be worn into surgery.  Leave suitcase in the car. After surgery it may be brought to your room.  For patients admitted to the hospital, checkout time is 11:00 AM the day of discharge.   Patients discharged the day of surgery will not be allowed to drive home.  Name and phone number of your driver: --  Special Instructions: CHG Shower Use Special Wash: 1/2 bottle night before surgery and 1/2 bottle morning of surgery.   Please read over the following fact sheets that you were given: Pain Booklet, Coughing and Deep Breathing, MRSA Information and Surgical Site Infection Prevention

## 2011-06-08 NOTE — Progress Notes (Signed)
Pt did not show up for PAT visit.  I called and she said was not aware that she had an appt. And she was not sure that she will be having surgery this week. "I have a skin issue and I have to follow up with my medical MD to see if I can have surgery.  I notified Dr. Cassandria Santee scheduler- Shanda Bumps of this information.

## 2011-06-09 ENCOUNTER — Telehealth: Payer: Self-pay | Admitting: *Deleted

## 2011-06-09 MED ORDER — PERMETHRIN 5 % EX CREA: TOPICAL_CREAM | CUTANEOUS | Status: DC

## 2011-06-09 NOTE — Telephone Encounter (Signed)
Attempted to reach pt and left detailed message on voicemail and to call if any questions. 

## 2011-06-09 NOTE — Telephone Encounter (Signed)
Rx sent for cream to pharmacy.  She should apply as directed. Trim fingernails and reapply to hands after handwashing.  Call if rash worsens of if not improved in 1 week. Wash all bedding/clothing in hot water.

## 2011-06-09 NOTE — Telephone Encounter (Signed)
Received message from pt stating she has been exposed to scabies. Now has a rash and is itching all over. Pt is requesting med be called to her pharmacy. Please advise.

## 2011-06-10 IMAGING — US US BIOPSY
1 series · 7 of 7 positions shown · non-contrast
Comparison: none

Clinical: Hepatitis C

ULTRASOUND-GUIDED RANDOM CORE LIVER BIOPSY:
An ultrasound guided liver biopsy was thoroughly discussed with the
patient and questions were answered. The benefits, risks,
alternatives, and complications were also discussed. The patient
understands and wishes to proceed with the procedure. A verbal as
well as written consent was obtained.
Ultrasound imaging of the liver was performed and an appropriate
skin entry site was determined. Skin site was marked, prepped and
draped in the usual sterile fashion. 1% Lidocaine was infiltrated
locally. A 17 gauge Trocar needle was advanced under ultrasound
guidance into the liver. Imaging was obtained documenting
appropriate needle position.  Three coaxial 18 gauge core samples
were then obtained and sent to the laboratory for further analysis.
Post procedure scans demonstrate no evidence of bleeding or
hematoma.  The patient tolerated the procedure well with no
immediate complication.
Cardiac and respiratory monitoring was provided by the radiology
RN.  Moderate sedation in the form of 100 mcg fentanyl and 3 mg of
versed were administered throughout the procedure for a total of 10
minutes.

[Series 1: us biopsy · 0.32mm/px · 7 of 7 slices shown]
[im 1/7]
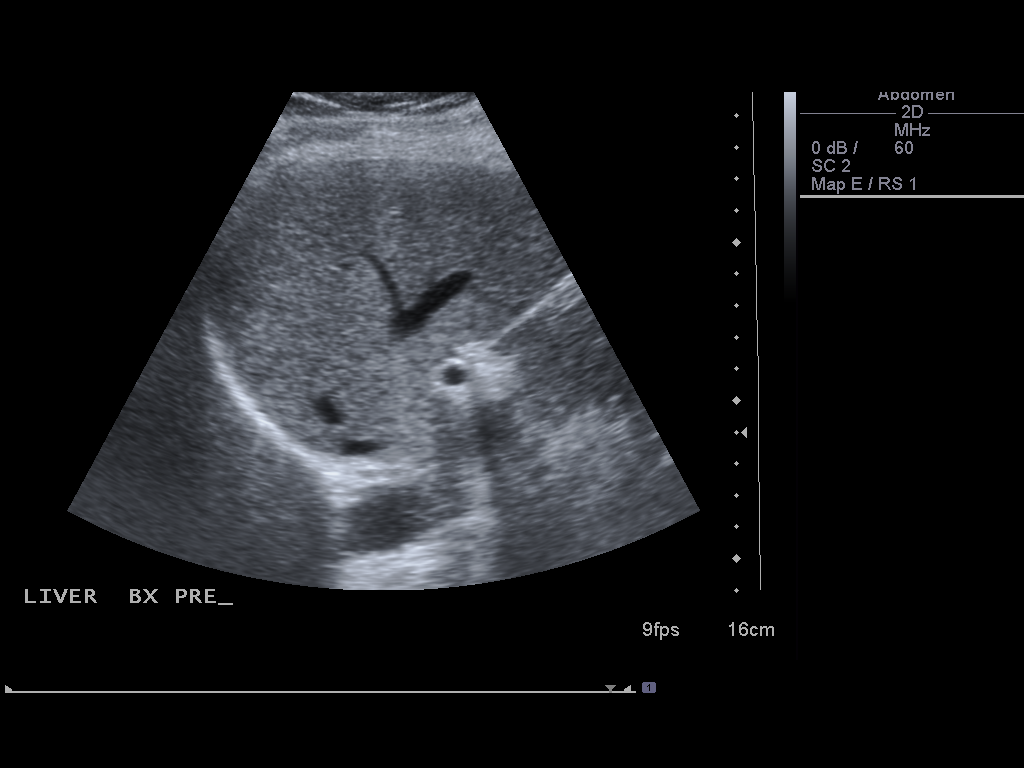
[im 2/7]
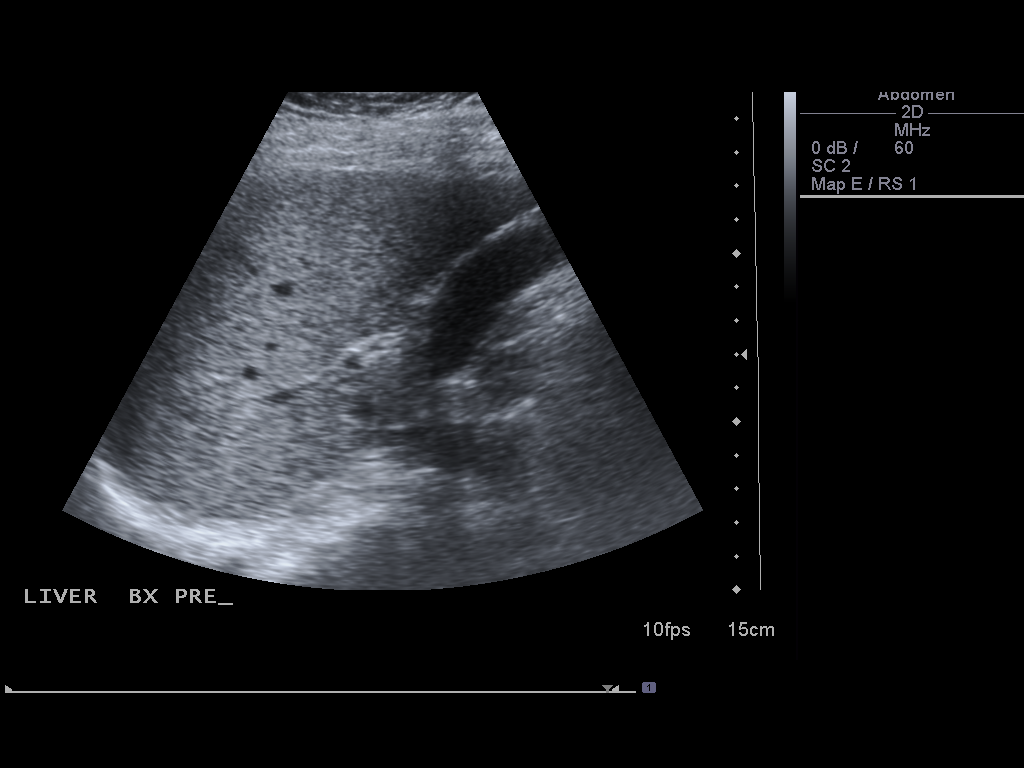
[im 3/7]
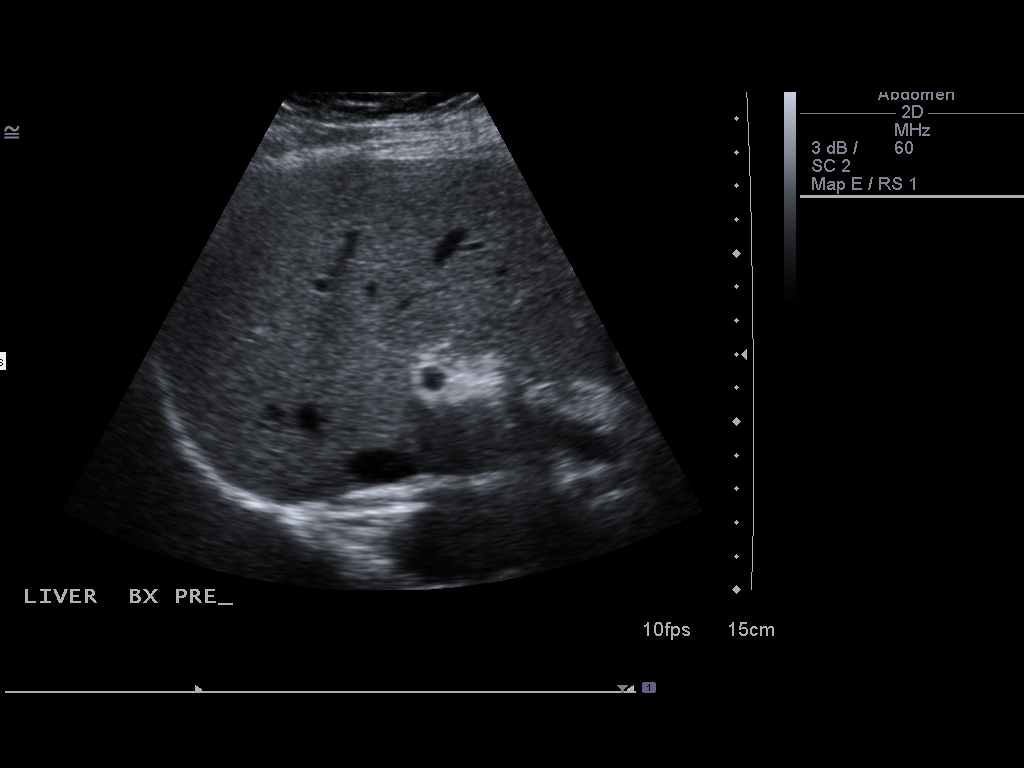
[im 4/7]
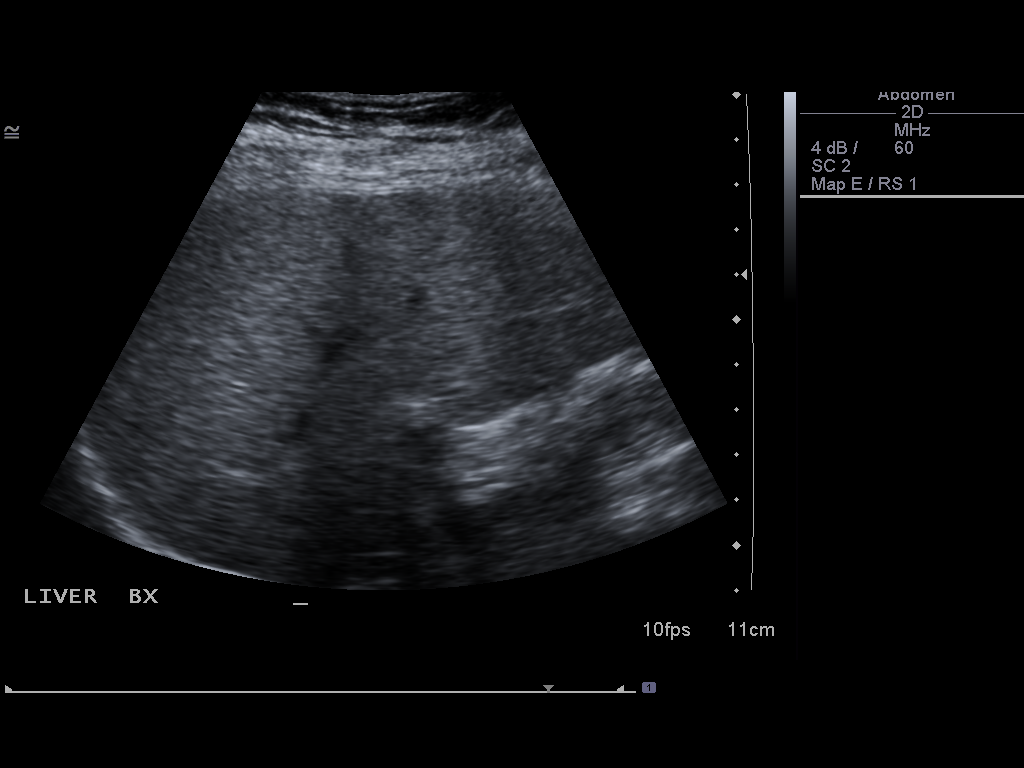
[im 5/7]
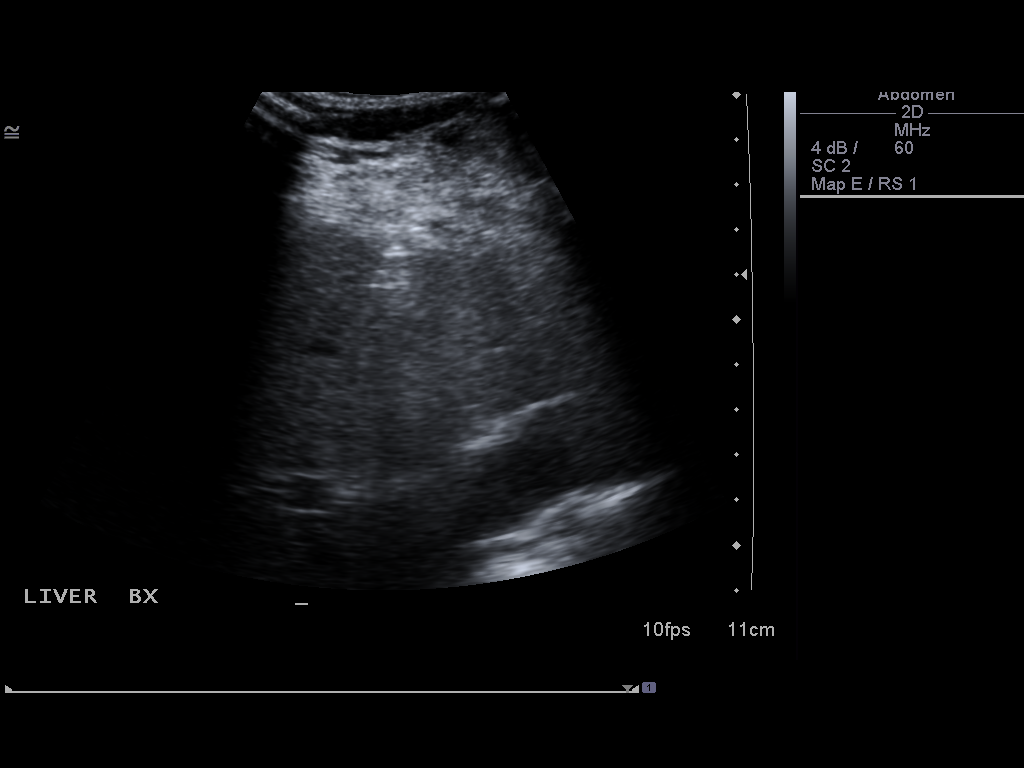
[im 6/7]
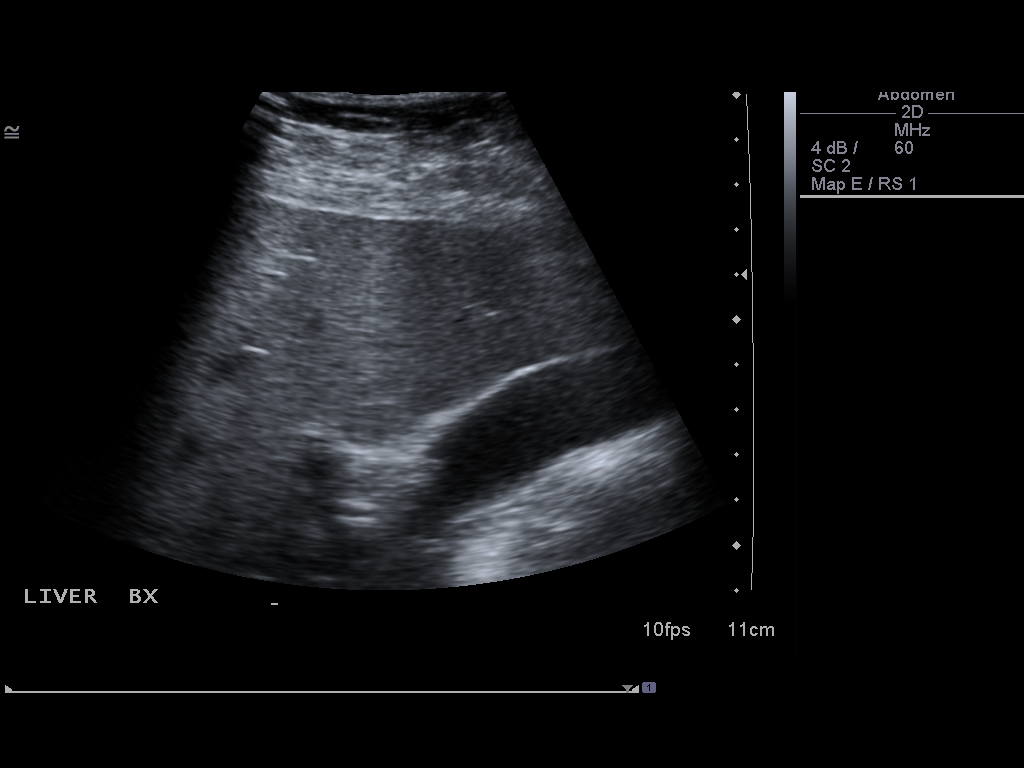
[im 7/7]
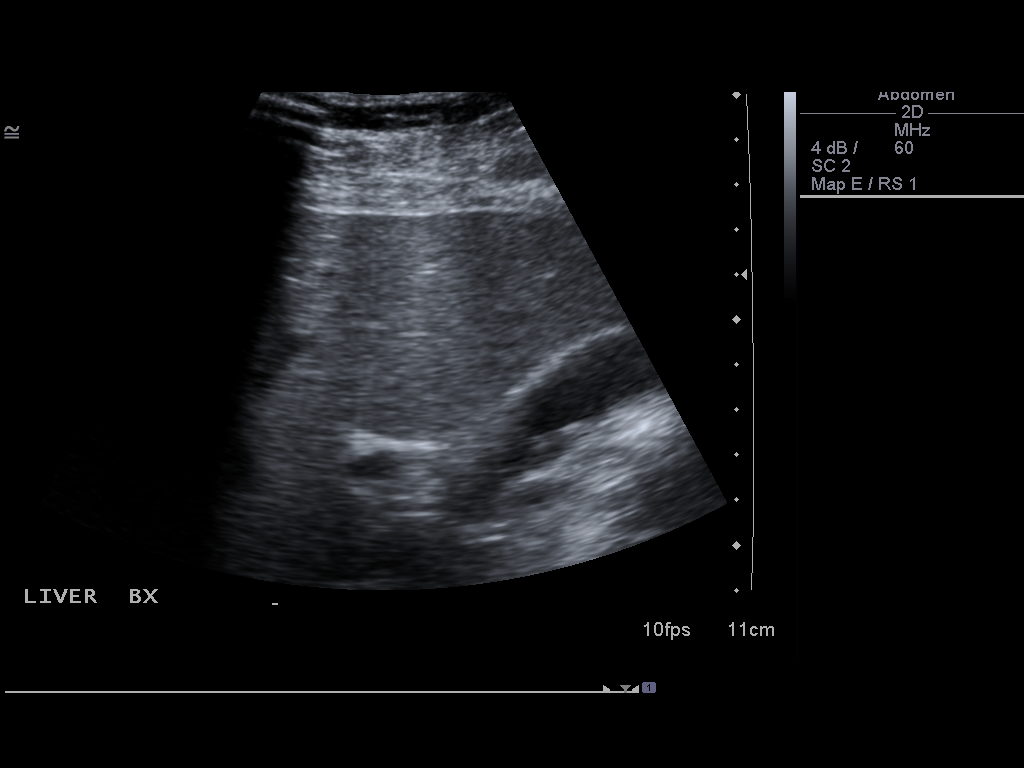

[7 of 7 positions shown; findings below may reference images not displayed]

IMPRESSION: 1. Technically successful ultrasound guided random liver core
biopsy with moderate sedation as described above.

Read by: Memmedov, Stasy.-CHRISTI

## 2011-06-12 ENCOUNTER — Encounter (HOSPITAL_COMMUNITY): Admission: RE | Payer: Self-pay | Source: Ambulatory Visit

## 2011-06-12 ENCOUNTER — Inpatient Hospital Stay (HOSPITAL_COMMUNITY): Admission: RE | Admit: 2011-06-12 | Payer: Medicare Other | Source: Ambulatory Visit | Admitting: Neurosurgery

## 2011-06-12 SURGERY — POSTERIOR LUMBAR FUSION 2 LEVEL
Anesthesia: General

## 2011-06-18 ENCOUNTER — Other Ambulatory Visit: Payer: Self-pay | Admitting: Family

## 2011-06-19 NOTE — Telephone Encounter (Signed)
Med last filled 05/28/11. Too soon to refill. Will call authorization on Monday.

## 2011-06-22 ENCOUNTER — Other Ambulatory Visit: Payer: Self-pay | Admitting: Family

## 2011-06-22 ENCOUNTER — Other Ambulatory Visit: Payer: Self-pay | Admitting: Neurosurgery

## 2011-06-22 ENCOUNTER — Telehealth: Payer: Self-pay | Admitting: *Deleted

## 2011-06-22 MED ORDER — CEFAZOLIN SODIUM 1-5 GM-% IV SOLN: 1.0000 g | INTRAVENOUS | Status: DC

## 2011-06-22 NOTE — Telephone Encounter (Signed)
Could you pls call pt and verify refill request?

## 2011-06-22 NOTE — Telephone Encounter (Signed)
[facility] Triage Call Report Triage Record Num: [id number] Operator: Maryfrances Bunnell Patient Name: Victoria Pratt Call Date & Time: 06/19/2011 9:52:45PM Patient Phone: [phone] PCP: Eda Keys Patient Gender: Female PCP Fax : Patient DOB: 03/10/1956 Practice Name: [facility] Reason for Call: Caller: Lattie/Patient; PCP: Peggyann Juba, Efraim Kaufmann; CB#: [phone]; Call regarding Medication Issue; Medication(s): Muscle relaxer; Pt asking for "Override for her Muscle Relaxer." States refil called in but pharmacy needs something else. [phone]. Patient will have them call in am. Protocol(s) Used: Office Note Recommended Outcome per Protocol: Information Noted and Sent to Office Reason for Outcome: Caller information to office Care Advice: ~ 06/19/2011 [id number] Page 1 of 1 [id number]

## 2011-06-22 NOTE — Telephone Encounter (Signed)
Left message on machine to return my call. 

## 2011-06-22 NOTE — Telephone Encounter (Signed)
Refill left on pharmacy voicemail Alprazolam 1mg  1 tablet three times daily as needed #90 x no refills.

## 2011-06-23 ENCOUNTER — Encounter (HOSPITAL_COMMUNITY): Admission: RE | Payer: Self-pay | Source: Ambulatory Visit

## 2011-06-23 ENCOUNTER — Inpatient Hospital Stay (HOSPITAL_COMMUNITY): Admission: RE | Admit: 2011-06-23 | Payer: Medicare Other | Source: Ambulatory Visit | Admitting: Neurosurgery

## 2011-06-23 SURGERY — POSTERIOR LUMBAR FUSION 2 LEVEL
Anesthesia: General

## 2011-06-24 NOTE — Telephone Encounter (Signed)
Pt has not returned my call. Alprazolam was called to the pharmacy on 06/22/11. Encounter is being closed.

## 2011-07-05 ENCOUNTER — Other Ambulatory Visit: Payer: Self-pay | Admitting: Family

## 2011-07-20 ENCOUNTER — Other Ambulatory Visit: Payer: Self-pay | Admitting: Family

## 2011-07-20 NOTE — Telephone Encounter (Signed)
Alprazolam 1mg  #90 x no refills left on pharmacy voicemail.

## 2011-08-06 ENCOUNTER — Other Ambulatory Visit: Payer: Self-pay | Admitting: Family

## 2011-08-06 NOTE — Telephone Encounter (Signed)
Spoke with Victoria Pratt at [facility] and verified that pt last picked up Alprazolam Rx #90 on 07/21/11. Advised him we will not fill medication early and to re-send request closer to time that refill will be due. Current request denied.

## 2011-08-07 NOTE — Telephone Encounter (Signed)
Agree 

## 2011-08-10 ENCOUNTER — Telehealth: Payer: Self-pay | Admitting: Family

## 2011-08-10 NOTE — Telephone Encounter (Signed)
Patient states that she broke her shoulder and went to [facility] on 07/30/11. Patient states that the ED lost her prescription of xanax. Patient states that she has not had any med since her visit to the ED. She would like a refill sent to [facility] on [address].

## 2011-08-10 NOTE — Telephone Encounter (Signed)
Spoke with pt, she states she was able to refill all of her other medications but still needs a refill on Xanax. Advised pt we will be unable to give early refill of Xanax regardless of circumstances and will not be able to refill it before 08/19/11. Pt voices understanding.

## 2011-08-12 ENCOUNTER — Telehealth: Payer: Self-pay | Admitting: *Deleted

## 2011-08-12 NOTE — Telephone Encounter (Signed)
Received call from [place] at [facility] re: duoneb order we signed on 01/14/11. They received pt's office notes and med list but states that they are needing [place] to addend any previous office visit re: pt's diagnosis, need for duoneb and treatment plan to comply with Medicare's audit of this order and fax it to them.  Please advise.

## 2011-08-12 NOTE — Progress Notes (Addendum)
  Subjective:    Patient ID: Victoria Pratt, female    DOB: September 24, 1955, 56 y.o.   MRN: [id number]  HPI    Review of Systems     Objective:   Physical Exam        Assessment & Plan:  Late Entry-  Rx was signed on 01/14/11 for pt to continue Duonebs BID for management of her COPD. This is in addition to her other listed medications to help control her COPD.

## 2011-08-13 NOTE — Telephone Encounter (Signed)
Called Marcelino Duster to obtain fax # and left message to call back with the #.

## 2011-08-18 ENCOUNTER — Other Ambulatory Visit: Payer: Self-pay | Admitting: Family

## 2011-08-18 NOTE — Telephone Encounter (Signed)
Addend was faxed back to [phone] on 08/14/11. Received call from [place] dated 08/17/11 that she is needed to know the date of service the addend was attached to. Addend was attached to 02/06/12 office visit. Refaxed notes and addend from 02/06/12 to above number.

## 2011-08-18 NOTE — Telephone Encounter (Signed)
Refill last sent 07/20/11. Alprazolam 1mg  tid as needed, #90 x no refills left on pharmacy voicemail.

## 2011-08-20 NOTE — Telephone Encounter (Signed)
Received message from [place] that the added chart entry must state the date of service it is attached to. They are requesting addend to state: Late chart entry for date of service 02/06/12. . .   Please advise.

## 2011-08-21 NOTE — Telephone Encounter (Signed)
Notified the audit department per instructions below.

## 2011-08-21 NOTE — Telephone Encounter (Signed)
Addendum was made to the date of service as requested. No further addendums will be made at this time.

## 2011-08-31 ENCOUNTER — Ambulatory Visit (INDEPENDENT_AMBULATORY_CARE_PROVIDER_SITE_OTHER): Payer: Medicare Other | Admitting: Family

## 2011-08-31 ENCOUNTER — Encounter: Payer: Self-pay | Admitting: Family

## 2011-08-31 VITALS — BP 124/84 | HR 76 | Temp 98.1°F | Resp 18 | Wt 150.0 lb

## 2011-08-31 DIAGNOSIS — F17200 Nicotine dependence, unspecified, uncomplicated: Secondary | ICD-10-CM

## 2011-08-31 DIAGNOSIS — S4292XA Fracture of left shoulder girdle, part unspecified, initial encounter for closed fracture: Secondary | ICD-10-CM | POA: Insufficient documentation

## 2011-08-31 DIAGNOSIS — I10 Essential (primary) hypertension: Secondary | ICD-10-CM

## 2011-08-31 DIAGNOSIS — S42209A Unspecified fracture of upper end of unspecified humerus, initial encounter for closed fracture: Secondary | ICD-10-CM

## 2011-08-31 DIAGNOSIS — J449 Chronic obstructive pulmonary disease, unspecified: Secondary | ICD-10-CM

## 2011-08-31 DIAGNOSIS — Z720 Tobacco use: Secondary | ICD-10-CM

## 2011-08-31 NOTE — Assessment & Plan Note (Signed)
BP is stable. Continue ziac.

## 2011-08-31 NOTE — Assessment & Plan Note (Signed)
Unchanged.  We again spoke about the importance of smoking cessation.

## 2011-08-31 NOTE — Assessment & Plan Note (Signed)
Seems about at baseline.  I have asked her to reschedule her apt with pulmonology.

## 2011-08-31 NOTE — Progress Notes (Signed)
Subjective:    Patient ID: Victoria Pratt, female    DOB: 01-22-1956, 56 y.o.   MRN: [id number]  HPI  COPD- A little worse at night due to allergies.  daliresp was too expensive.  Taking albuterol neb and inhaler.  Occasional sipiriva.    Tobacco abuse- she reports that she continues to smoke.     Fracture of left shoulder- 4/18- cracked her left shoulder- now in a sling.  Following with Ortho through [facility] and participating in PT.     Review of Systems See HPI  Past Medical History  Diagnosis Date  . Asthma   . Arthritis   . Depression   . Emphysema   . Hepatitis C   . Hyperlipidemia   . Chronic low back pain   . Hypertension   . Anxiety   . COPD (chronic obstructive pulmonary disease)   . Hyperglycemia   . Hepatitis C     History   Social History  . Marital Status: Single    Spouse Name: N/A    Number of Children: 4  . Years of Education: N/A   Occupational History  . Not on file.   Social History Main Topics  . Smoking status: Current Everyday Smoker -- 0.5 packs/day for 35 years    Types: Cigarettes  . Smokeless tobacco: Never Used  . Alcohol Use: No     remote history of cocaine use.    . Drug Use: No  . Sexually Active: Not on file   Other Topics Concern  . Not on file   Social History Narrative   Regular exercise:  Walks a lotCaffeine use: 2 cups coffee daily, soda and tea.She is on disability due to her COPD.Divorced4 children (2 daughters and 2 sons)    Past Surgical History  Procedure Date  . Head surgery 56 yrs old    had plate put in head  . Cesarean section     x 2  . Oophorectomy 1992    right ovary removed due to cyst  . Liver biopsy 04/2008  . Bladder sling 2009    Family History  Problem Relation Age of Onset  . Arthritis Mother     rheumatoid  . Hypertension Mother   . Heart disease Father   . Arthritis Sister     rheumatoid  . Hypertension Sister   . Cancer Sister     ? in her leg ?    Allergies  Allergen  Reactions  . Codeine Swelling    Current Outpatient Prescriptions on File Prior to Visit  Medication Sig Dispense Refill  . albuterol (PROVENTIL HFA;VENTOLIN HFA) 108 (90 BASE) MCG/ACT inhaler Inhale 2 puffs into the lungs daily as needed.  1 Inhaler  2  . ALPRAZolam (XANAX) 1 MG tablet TAKE 1 TABLET BY MOUTH THREE TIMES DAILY AS NEEDED  90 tablet  0  . bisoprolol-hydrochlorothiazide (ZIAC) 10-6.25 MG per tablet TAKE 1 TABLET BY MOUTH DAILY  30 tablet  0  . gabapentin (NEURONTIN) 800 MG tablet TAKE 1 TABLET BY MOUTH FOUR TIMES DAILY  120 tablet  2  . omega-3 acid ethyl esters (LOVAZA) 1 G capsule Take 4 capsules daily.       Marland Kitchen omeprazole (PRILOSEC) 40 MG capsule TAKE 1 CAPSULE EVERY DAY  30 capsule  2  . tiotropium (SPIRIVA) 18 MCG inhalation capsule Place 1 capsule (18 mcg total) into inhaler and inhale daily.  30 capsule  2  . roflumilast (DALIRESP) 500 MCG TABS tablet  Take 1 tablet (500 mcg total) by mouth daily.  30 tablet  2    BP 124/84  Pulse 76  Temp(Src) 98.1 F (36.7 C) (Oral)  Resp 18  Wt 150 lb (68.04 kg)  SpO2 95%       Objective:   Physical Exam  Constitutional: She appears well-developed and well-nourished. No distress.  Cardiovascular: Normal rate and regular rhythm.   No murmur heard. Pulmonary/Chest: Effort normal. No respiratory distress. She has wheezes. She has no rales.  Abdominal: Soft.  Musculoskeletal: She exhibits no edema.       Left arm in sling.  Skin: Skin is warm and dry.  Psychiatric: She has a normal mood and affect. Her behavior is normal. Judgment and thought content normal.          Assessment & Plan:

## 2011-08-31 NOTE — Patient Instructions (Signed)
Please reschedule your pulmonary appointment at the front desk. Follow up in 3 months.

## 2011-08-31 NOTE — Assessment & Plan Note (Addendum)
Defer management to orthopedics.  Consider dexa scan next visit.

## 2011-09-01 ENCOUNTER — Telehealth: Payer: Self-pay | Admitting: *Deleted

## 2011-09-01 ENCOUNTER — Other Ambulatory Visit: Payer: Self-pay | Admitting: Family

## 2011-09-01 NOTE — Telephone Encounter (Signed)
Received call from [facility] pharmacy stating pt is requesting portable O2 tank and home unit. She is requesting order from [place].  Advised her per 03/2011 office note that ambulatory O2 sat on room air remained stable and management of O2 was deferred to pulmonology which pt never followed up with. Pt has upcoming pulmonology appt 10/26/11. Elease Hashimoto will contact the pt and notify her of need to obtain from pulmonology.

## 2011-09-01 NOTE — Telephone Encounter (Signed)
Received call from pt stating she forgot to tell Melissa that her medications were misplaced after her accident with her shoulder. States she has been out of gabapentin and xanax and needs a refill on these. Called pt and advised her that she notified us of this incident on 08/10/11 at which time I explained to her that we authorized early refills on all medications except xanax as we are unable to provide early refills of this regardless of the reason. Pt does not remember this conversation and reports that she did not pick up xanax on 08/18/11. Advised pt to contact the pharmacy to see if rx is still on file for xanax. Pt voices understanding.

## 2011-09-02 ENCOUNTER — Other Ambulatory Visit: Payer: Self-pay | Admitting: Family

## 2011-09-02 MED ORDER — TIOTROPIUM BROMIDE MONOHYDRATE 18 MCG IN CAPS: 18.0000 ug | ORAL_CAPSULE | Freq: Every day | RESPIRATORY_TRACT | Status: DC

## 2011-09-02 NOTE — Telephone Encounter (Signed)
See previous refill note

## 2011-09-02 NOTE — Telephone Encounter (Signed)
Spoke with pharmacist and verified that pt had not picked up xanax refill from 08/18/11 and gabapentin still has 1 refill left. He will fill rxs from refills on file. Current requests denied.

## 2011-09-02 NOTE — Telephone Encounter (Signed)
Received voice message from pt that she just spoke with the pharmacist and he told her she would have to contact us for the early refills of gabapentin and xanax.  Also states that she needs refills on spiriva; refill sent. Called back and spoke to pharmacist, Vishel and reminded him of our conversation this morning. He states that he misinformed me on the Xanax rx. He states the xanax was filled and picked up on 08/18/11. He has filled the gabapentin today.

## 2011-09-02 NOTE — Telephone Encounter (Signed)
Notified pt of pharmacy refill of xanax on 08/18/11 and that it was picked up. Advised pt we will be unable to give early refill on this medication and to contact pharmacy for a refill when it is due around 09/18/11. Pt voices understanding.

## 2011-09-02 NOTE — Telephone Encounter (Signed)
Addended by: Mervin Kung A on: 09/02/2011 01:45 PM   Modules accepted: Orders

## 2011-09-11 ENCOUNTER — Telehealth: Payer: Self-pay | Admitting: *Deleted

## 2011-09-11 NOTE — Telephone Encounter (Signed)
Received fax from [facility] requesting order for CPAP supplies. Spoke with pt and advised her we will not be completing this order as it should come from the pulmonologist per previous instructions. She states they also sent her an order. Advised pt to take order with her to pulmonology appt in July. Pt voices understanding and denial faxed to [facility].

## 2011-09-14 ENCOUNTER — Telehealth: Payer: Self-pay | Admitting: Family

## 2011-09-14 NOTE — Telephone Encounter (Signed)
Rx printed and was called to [facility] voicemail #90 x no refills and may not fill before 09/16/11.

## 2011-09-30 ENCOUNTER — Other Ambulatory Visit: Payer: Self-pay | Admitting: Family

## 2011-09-30 NOTE — Telephone Encounter (Signed)
Call placed to pharmacy. Pharmacist stated refills were not received on May 22. Verbal refill provided to pharmacist.

## 2011-10-03 ENCOUNTER — Other Ambulatory Visit: Payer: Self-pay | Admitting: Family

## 2011-10-05 NOTE — Telephone Encounter (Signed)
Alprazolam request denied as it is too early to refill medication. Verified with pharmacist that last rx was picked up on 09/16/11.  Next refill will be due on 10/16/11 and should be refilled no earlier than 10/14/11.

## 2011-10-12 ENCOUNTER — Other Ambulatory Visit: Payer: Self-pay | Admitting: Family

## 2011-10-12 NOTE — Telephone Encounter (Signed)
Refills left on pharmacy voicemail for alprazolam #90 x no refills and bisoprolol hctz 10-6.25mg  #30 x 1 refill.

## 2011-10-16 ENCOUNTER — Telehealth: Payer: Self-pay | Admitting: *Deleted

## 2011-10-16 NOTE — Telephone Encounter (Signed)
Received fax from [facility] pharmacy for ipratropium/albuterol. Called and spoke with Gearldine Bienenstock at [facility], advised her that we had submitted additional information in May re: addendum to support need for medication and no further information/addendums would be submitted. Advised her that pt has upcoming appt with Pulmonology on 10/26/11 and they may want to co-ordinate order with them. She states she will contact the pt to discuss.

## 2011-10-26 ENCOUNTER — Institutional Professional Consult (permissible substitution): Payer: Medicare Other | Admitting: Pulmonary Disease

## 2011-10-29 ENCOUNTER — Institutional Professional Consult (permissible substitution): Payer: Medicare Other | Admitting: Pulmonary Disease

## 2011-11-07 ENCOUNTER — Other Ambulatory Visit: Payer: Self-pay | Admitting: Family

## 2011-11-09 NOTE — Telephone Encounter (Signed)
Alprazolam #90 x no refills left on pharmacy voicemail. Rx last filled 10/12/11.

## 2011-11-24 ENCOUNTER — Other Ambulatory Visit: Payer: Self-pay | Admitting: Family

## 2011-11-25 ENCOUNTER — Other Ambulatory Visit: Payer: Self-pay | Admitting: Family

## 2011-11-25 NOTE — Telephone Encounter (Signed)
Gabapentin refill was previously sent to [facility] for #120 x 3 refills on 09/30/11. Pt should still have refills on file. Denial sent to [facility] and note to check refills on file. Left message on pharmacy voicemail to check refills on file.

## 2011-11-26 NOTE — Telephone Encounter (Signed)
Gabapentin request denied as refills were phoned in on 09/30/11 #120 x 3 refills. Spoke with Riki Rusk at [facility] and verified refills are on file. He states he will refill from previous rx and to discard request.

## 2011-12-01 ENCOUNTER — Ambulatory Visit: Payer: Medicare Other | Admitting: Family

## 2011-12-01 DIAGNOSIS — Z0289 Encounter for other administrative examinations: Secondary | ICD-10-CM

## 2011-12-02 ENCOUNTER — Other Ambulatory Visit: Payer: Self-pay | Admitting: Family

## 2011-12-10 ENCOUNTER — Other Ambulatory Visit: Payer: Self-pay | Admitting: Family

## 2011-12-17 ENCOUNTER — Institutional Professional Consult (permissible substitution): Payer: Medicare Other | Admitting: Pulmonary Disease

## 2012-01-07 ENCOUNTER — Ambulatory Visit: Payer: Medicare Other | Admitting: Internal Medicine

## 2012-01-11 ENCOUNTER — Other Ambulatory Visit: Payer: Self-pay | Admitting: Family

## 2012-01-12 NOTE — Telephone Encounter (Signed)
Alprazolam request [Last Rx 08.29.13 #90x0 Phone In]/SLS Was this script phoned in? Thanks.

## 2012-01-13 NOTE — Telephone Encounter (Signed)
Rx called to [facility]. Pt is past due for 3 month f/u. Please call pt to arrange appt.

## 2012-01-13 NOTE — Telephone Encounter (Signed)
Patient scheduled 3 month follow up for 01/15/12

## 2012-01-15 ENCOUNTER — Ambulatory Visit (INDEPENDENT_AMBULATORY_CARE_PROVIDER_SITE_OTHER): Payer: Medicare Other | Admitting: Family

## 2012-01-15 ENCOUNTER — Encounter: Payer: Self-pay | Admitting: Family

## 2012-01-15 VITALS — BP 112/80 | HR 70 | Resp 16 | Wt 151.1 lb

## 2012-01-15 DIAGNOSIS — I10 Essential (primary) hypertension: Secondary | ICD-10-CM

## 2012-01-15 DIAGNOSIS — J449 Chronic obstructive pulmonary disease, unspecified: Secondary | ICD-10-CM

## 2012-01-15 DIAGNOSIS — G4730 Sleep apnea, unspecified: Secondary | ICD-10-CM

## 2012-01-15 DIAGNOSIS — Z23 Encounter for immunization: Secondary | ICD-10-CM

## 2012-01-15 MED ORDER — MALATHION 0.5 % EX LOTN: TOPICAL_LOTION | Freq: Once | CUTANEOUS | Status: AC

## 2012-01-15 NOTE — Patient Instructions (Addendum)
Leave lotion on for 8-12 hours. Complete your blood work prior to leaving.  Please schedule a follow up appointment in 3 months.

## 2012-01-15 NOTE — Assessment & Plan Note (Signed)
To establish with pulmonary for further evaluation/treatment.

## 2012-01-15 NOTE — Progress Notes (Signed)
Subjective:    Patient ID: Victoria Pratt, female    DOB: 09/18/1955, 56 y.o.   MRN: [id number]  HPI  Victoria Pratt is a 56 yr old female who presents today for follow up.  HTN- she denies cp/sob or LE edema.  COPD- Reports that breathing is "a little bit rougher."  She continues spiriva.  Using albuterol bid.    OSA- she reports that she is scheduled to see pulmonary specialist next week.   Head lice-  Was exposed to sister's grandchildren who had lice. Did over the counter treatment twice.   First noticed 1.5 weeks ago.  Washed everything.  Still having itching.    Review of Systems    see HPI  Past Medical History  Diagnosis Date  . Asthma   . Arthritis   . Depression   . Emphysema   . Hepatitis C   . Hyperlipidemia   . Chronic low back pain   . Hypertension   . Anxiety   . COPD (chronic obstructive pulmonary disease)   . Hyperglycemia   . Hepatitis C     History   Social History  . Marital Status: Single    Spouse Name: N/A    Number of Children: 4  . Years of Education: N/A   Occupational History  . Not on file.   Social History Main Topics  . Smoking status: Current Every Day Smoker -- 0.5 packs/day for 35 years    Types: Cigarettes  . Smokeless tobacco: Never Used   Comment: Using nicorette gum.  Marland Kitchen Alcohol Use: No     remote history of cocaine use.    . Drug Use: No  . Sexually Active: Not on file   Other Topics Concern  . Not on file   Social History Narrative   Regular exercise:  Walks a lotCaffeine use: 2 cups coffee daily, soda and tea.She is on disability due to her COPD.Divorced4 children (2 daughters and 2 sons)    Past Surgical History  Procedure Date  . Head surgery 56 yrs old    had plate put in head  . Cesarean section     x 2  . Oophorectomy 1992    right ovary removed due to cyst  . Liver biopsy 04/2008  . Bladder sling 2009    Family History  Problem Relation Age of Onset  . Arthritis Mother     rheumatoid  .  Hypertension Mother   . Heart disease Father   . Arthritis Sister     rheumatoid  . Hypertension Sister   . Cancer Sister     ? in her leg ?    Allergies  Allergen Reactions  . Codeine Swelling    Current Outpatient Prescriptions on File Prior to Visit  Medication Sig Dispense Refill  . albuterol (PROVENTIL HFA;VENTOLIN HFA) 108 (90 BASE) MCG/ACT inhaler Inhale 2 puffs into the lungs daily as needed.  1 Inhaler  2  . ALPRAZolam (XANAX) 1 MG tablet TAKE ONE TABLET BY MOUTH THREE TIMES DAILY AS NEEDED  90 tablet  0  . bisoprolol-hydrochlorothiazide (ZIAC) 10-6.25 MG per tablet TAKE 1 TABLET BY MOUTH DAILY  30 tablet  0  . bisoprolol-hydrochlorothiazide (ZIAC) 10-6.25 MG per tablet TAKE 1 TABLET BY MOUTH DAILY  30 tablet  1  . bisoprolol-hydrochlorothiazide (ZIAC) 10-6.25 MG per tablet TAKE 1 TABLET BY MOUTH EVERY DAY  30 tablet  0  . gabapentin (NEURONTIN) 800 MG tablet TAKE 1 TABLET BY MOUTH FOUR TIMES  DAILY  120 tablet  3  . gabapentin (NEURONTIN) 800 MG tablet TAKE 1 TABLET BY MOUTH FOUR TIMES DAILY  120 tablet  3  . omega-3 acid ethyl esters (LOVAZA) 1 G capsule Take 4 capsules daily.       Marland Kitchen omeprazole (PRILOSEC) 40 MG capsule TAKE 1 CAPSULE EVERY DAY  30 capsule  2  . tiotropium (SPIRIVA) 18 MCG inhalation capsule Place 1 capsule (18 mcg total) into inhaler and inhale daily.  30 capsule  2    BP 112/80  Pulse 70  Resp 16  Wt 151 lb 1.9 oz (68.548 kg)  SpO2 97%    Objective:   Physical Exam  Constitutional: She appears well-developed and well-nourished. No distress.  Cardiovascular: Normal rate and regular rhythm.   No murmur heard. Pulmonary/Chest: Effort normal and breath sounds normal. No respiratory distress. She has no wheezes. She has no rales. She exhibits no tenderness.  Skin: Skin is warm and dry.       No visible headlice but small nits are noted on strands of hair.           Assessment & Plan:

## 2012-01-15 NOTE — Assessment & Plan Note (Signed)
BP stable on ziac, continue same.  Obtain bmet.

## 2012-01-15 NOTE — Assessment & Plan Note (Addendum)
+  wheezing today.  She has been intolerant to inhaled steroids due to thrush.  Has apt next week with pulmonary. Continue albuterol and spirirva.

## 2012-01-20 ENCOUNTER — Telehealth: Payer: Self-pay | Admitting: Family

## 2012-01-20 NOTE — Telephone Encounter (Signed)
Pls call pt and remind her to return to complete bmet from 10/4 apt.

## 2012-01-28 ENCOUNTER — Institutional Professional Consult (permissible substitution): Payer: Medicare Other | Admitting: Pulmonary Disease

## 2012-01-30 ENCOUNTER — Other Ambulatory Visit: Payer: Self-pay | Admitting: Family

## 2012-02-01 NOTE — Telephone Encounter (Signed)
Received duplicate request for bisoprolol hctz. Approved #30 x 3 refills and denied duplicate request.

## 2012-02-03 NOTE — Telephone Encounter (Signed)
Notified pt. She reports that she has a sick family member that is dying and she will not be able to complete bloodwork for 2-3 more weeks as she will be staying with her family during that time.

## 2012-02-10 ENCOUNTER — Telehealth: Payer: Self-pay | Admitting: Family

## 2012-02-10 MED ORDER — ALPRAZOLAM 1 MG PO TABS: 1.0000 mg | ORAL_TABLET | Freq: Three times a day (TID) | ORAL | Status: DC | PRN

## 2012-02-10 NOTE — Telephone Encounter (Signed)
Refill- alprazolam 1mg  tablet. Take one tablet three times daily as needed. Qty 90 last fill 10.2.13

## 2012-02-10 NOTE — Telephone Encounter (Signed)
Refill left on pharmacy voicemail #90 x no refills. 

## 2012-03-03 ENCOUNTER — Institutional Professional Consult (permissible substitution): Payer: Medicare Other | Admitting: Pulmonary Disease

## 2012-03-04 ENCOUNTER — Other Ambulatory Visit: Payer: Self-pay | Admitting: Neurosurgery

## 2012-03-04 DIAGNOSIS — M5410 Radiculopathy, site unspecified: Secondary | ICD-10-CM

## 2012-03-04 DIAGNOSIS — M549 Dorsalgia, unspecified: Secondary | ICD-10-CM

## 2012-03-08 ENCOUNTER — Inpatient Hospital Stay: Admission: RE | Admit: 2012-03-08 | Payer: Medicare Other | Source: Ambulatory Visit

## 2012-03-08 ENCOUNTER — Inpatient Hospital Stay
Admission: RE | Admit: 2012-03-08 | Discharge: 2012-03-08 | Payer: Medicare Other | Source: Ambulatory Visit | Attending: Neurosurgery | Admitting: Neurosurgery

## 2012-03-14 ENCOUNTER — Telehealth: Payer: Self-pay | Admitting: Family

## 2012-03-14 MED ORDER — ALPRAZOLAM 1 MG PO TABS: 1.0000 mg | ORAL_TABLET | Freq: Three times a day (TID) | ORAL | Status: DC | PRN

## 2012-03-14 NOTE — Telephone Encounter (Signed)
Refill- alprazolam 1mg  tablets. Take one tablet three times daily as needed. Qty 90 last fill 10.30.13

## 2012-03-14 NOTE — Telephone Encounter (Signed)
Rx last filled on 02/10/12.  Refill left on pharmacy voicemail for #90 x no refills.

## 2012-03-21 ENCOUNTER — Inpatient Hospital Stay: Admission: RE | Admit: 2012-03-21 | Payer: Medicare Other | Source: Ambulatory Visit

## 2012-03-21 ENCOUNTER — Inpatient Hospital Stay
Admission: RE | Admit: 2012-03-21 | Discharge: 2012-03-21 | Payer: Medicare Other | Source: Ambulatory Visit | Attending: Neurosurgery | Admitting: Neurosurgery

## 2012-03-28 ENCOUNTER — Inpatient Hospital Stay
Admission: RE | Admit: 2012-03-28 | Discharge: 2012-03-28 | Payer: Medicare Other | Source: Ambulatory Visit | Attending: Neurosurgery | Admitting: Neurosurgery

## 2012-03-28 ENCOUNTER — Other Ambulatory Visit: Payer: Medicare Other

## 2012-04-05 ENCOUNTER — Other Ambulatory Visit: Payer: Self-pay | Admitting: *Deleted

## 2012-04-05 NOTE — Telephone Encounter (Signed)
Request for Alprazolam 1 mg tablet [Last Rx 12.02.13 #90x0]/SLS Thanks.

## 2012-04-05 NOTE — Telephone Encounter (Signed)
Rx last called to pharmacy on 03/14/12 #90. Rx not due yet. Will send refill on 04/11/12.

## 2012-04-14 MED ORDER — ALPRAZOLAM 1 MG PO TABS: 1.0000 mg | ORAL_TABLET | Freq: Three times a day (TID) | ORAL | Status: DC | PRN

## 2012-04-14 MED ORDER — OMEPRAZOLE 40 MG PO CPDR: 40.0000 mg | DELAYED_RELEASE_CAPSULE | Freq: Every day | ORAL | Status: DC

## 2012-04-14 NOTE — Telephone Encounter (Signed)
Received call from pt requesting status of alprazolam refill. Also requests refill of omeprazole. Advised pt that alprazolam is due to be refilled today and will also send omeprazole. Pt scheduled f/u with Melissa for 04/19/12 at 2:15pm.

## 2012-04-19 ENCOUNTER — Ambulatory Visit: Payer: Medicare Other | Admitting: Family

## 2012-04-26 ENCOUNTER — Ambulatory Visit (INDEPENDENT_AMBULATORY_CARE_PROVIDER_SITE_OTHER): Payer: Medicare Other | Admitting: Family

## 2012-04-26 ENCOUNTER — Encounter: Payer: Self-pay | Admitting: Family

## 2012-04-26 VITALS — BP 130/88 | HR 70 | Temp 98.3°F | Resp 16 | Wt 149.1 lb

## 2012-04-26 DIAGNOSIS — J449 Chronic obstructive pulmonary disease, unspecified: Secondary | ICD-10-CM

## 2012-04-26 DIAGNOSIS — M545 Low back pain: Secondary | ICD-10-CM

## 2012-04-26 DIAGNOSIS — G8929 Other chronic pain: Secondary | ICD-10-CM

## 2012-04-26 DIAGNOSIS — B1920 Unspecified viral hepatitis C without hepatic coma: Secondary | ICD-10-CM

## 2012-04-26 NOTE — Assessment & Plan Note (Signed)
Will refer to pain management

## 2012-04-26 NOTE — Patient Instructions (Addendum)
Please schedule a medicare wellness visit in the next 1-2 months.  You will be contacted about your referral to Infectious disease for Hepatitis C as well as pain management.  Please let us know if you have not heard back within 1 week about your referrals.

## 2012-04-26 NOTE — Assessment & Plan Note (Signed)
Will refer to infectious disease for further evaluation and possible treatment.

## 2012-04-26 NOTE — Progress Notes (Signed)
Subjective:    Patient ID: Victoria Pratt, female    DOB: 19-Sep-1955, 57 y.o.   MRN: [id number]  HPI  Reports some abdominal cramping- like a spasm.  Worse in the mornings.  HTN-  She continues bisoprolol-hctz.    Hepatitis C- she reports that she had a biopsy 2 years ago at [facility] and was told that   COPD- down to <1/2 PPD.  Breathing well on spiriva.      Review of Systems See HPI  Past Medical History  Diagnosis Date  . Asthma   . Arthritis   . Depression   . Emphysema   . Hyperlipidemia   . Chronic low back pain   . Hypertension   . Anxiety   . COPD (chronic obstructive pulmonary disease)   . Hyperglycemia   . Hepatitis C   . Hepatitis C     History   Social History  . Marital Status: Single    Spouse Name: N/A    Number of Children: 4  . Years of Education: N/A   Occupational History  . Not on file.   Social History Main Topics  . Smoking status: Current Every Day Smoker -- 35 years    Types: Cigarettes  . Smokeless tobacco: Never Used     Comment: Less than 1/2 PPD  . Alcohol Use: No     Comment: remote history of cocaine use.    . Drug Use: No  . Sexually Active: Not on file   Other Topics Concern  . Not on file   Social History Narrative   Regular exercise:  Walks a lotCaffeine use: 2 cups coffee daily, soda and tea.She is on disability due to her COPD.Divorced4 children (2 daughters and 2 sons)    Past Surgical History  Procedure Date  . Head surgery 57 yrs old    had plate put in head  . Cesarean section     x 2  . Oophorectomy 1992    right ovary removed due to cyst  . Liver biopsy 04/2008  . Bladder sling 2009    Family History  Problem Relation Age of Onset  . Arthritis Mother     rheumatoid  . Hypertension Mother   . Heart disease Father   . Arthritis Sister     rheumatoid  . Hypertension Sister   . Cancer Sister     ? in her leg ?    Allergies  Allergen Reactions  . Codeine Swelling    Current Outpatient  Prescriptions on File Prior to Visit  Medication Sig Dispense Refill  . albuterol (PROVENTIL HFA;VENTOLIN HFA) 108 (90 BASE) MCG/ACT inhaler Inhale 2 puffs into the lungs daily as needed.  1 Inhaler  2  . ALPRAZolam (XANAX) 1 MG tablet Take 1 tablet (1 mg total) by mouth 3 (three) times daily as needed for sleep.  90 tablet  0  . bisoprolol-hydrochlorothiazide (ZIAC) 10-6.25 MG per tablet TAKE ONE TABLET BY MOUTH DAILY  30 tablet  3  . gabapentin (NEURONTIN) 800 MG tablet TAKE 1 TABLET BY MOUTH FOUR TIMES DAILY  120 tablet  3  . gabapentin (NEURONTIN) 800 MG tablet TAKE 1 TABLET BY MOUTH FOUR TIMES DAILY  120 tablet  3  . malathion (OVIDE) 0.5 % lotion Apply topically once. Sprinkle lotion on dry hair and rub gently until the scalp is thoroughly moistened. Allow to dry naturally and leave uncovered.  59 mL  0  . omega-3 acid ethyl esters (LOVAZA) 1 G  capsule Take 4 capsules daily.       Marland Kitchen omeprazole (PRILOSEC) 40 MG capsule Take 1 capsule (40 mg total) by mouth daily.  30 capsule  2  . tiotropium (SPIRIVA) 18 MCG inhalation capsule Place 1 capsule (18 mcg total) into inhaler and inhale daily.  30 capsule  2    BP 130/88  Pulse 70  Temp 98.3 F (36.8 C) (Oral)  Resp 16  Wt 149 lb 1.3 oz (67.622 kg)  SpO2 94%       Objective:   Physical Exam  Constitutional: She appears well-developed and well-nourished. No distress.  Cardiovascular: Normal rate and regular rhythm.   No murmur heard. Pulmonary/Chest: Effort normal and breath sounds normal. No respiratory distress. She has no wheezes. She has no rales. She exhibits no tenderness.  Abdominal: Soft. Bowel sounds are normal.  Musculoskeletal: She exhibits no edema.  Psychiatric: She has a normal mood and affect. Her behavior is normal. Judgment and thought content normal.          Assessment & Plan:

## 2012-04-26 NOTE — Assessment & Plan Note (Signed)
Continue spiriva and albuterol.  Encouraged complete smoking cessation.

## 2012-05-06 ENCOUNTER — Telehealth: Payer: Self-pay | Admitting: Family

## 2012-05-06 NOTE — Telephone Encounter (Signed)
Could you pls call pt and let her know that Dr. Wynn Banker would not schedule her for pain management due to several no shows and a cancellation.  If should would like to try elsewhere for pain management, please fax referral request.

## 2012-05-10 ENCOUNTER — Telehealth: Payer: Self-pay | Admitting: Family

## 2012-05-10 NOTE — Telephone Encounter (Signed)
Last refill was called in on 04/14/12 #90 x no refills. Will wait until closer to refill date to call in Rx.

## 2012-05-10 NOTE — Telephone Encounter (Signed)
Refill- alprazolam 1mg  tablets. Take one tablet 3 times a day as needed. Qty 90 last fill 1.2.14

## 2012-05-12 ENCOUNTER — Telehealth: Payer: Self-pay | Admitting: Family

## 2012-05-12 MED ORDER — TIOTROPIUM BROMIDE MONOHYDRATE 18 MCG IN CAPS: 18.0000 ug | ORAL_CAPSULE | Freq: Every day | RESPIRATORY_TRACT | Status: DC

## 2012-05-12 NOTE — Telephone Encounter (Signed)
She is already established and may contact their office to schedule follow up appointment.

## 2012-05-12 NOTE — Telephone Encounter (Signed)
Received additional request

## 2012-05-12 NOTE — Telephone Encounter (Signed)
Pt left message requesting status of alprazolam request and also requesting spiriva refill. Left detailed message on home # re: completion of spiriva and alprazolam will be called in tomorrow as refill is not due until 05/14/12.

## 2012-05-12 NOTE — Telephone Encounter (Signed)
Patient like a referral for  Dr  Jeral Fruit, for her back pain

## 2012-05-13 ENCOUNTER — Telehealth: Payer: Self-pay | Admitting: *Deleted

## 2012-05-13 MED ORDER — ALPRAZOLAM 1 MG PO TABS: 1.0000 mg | ORAL_TABLET | Freq: Three times a day (TID) | ORAL | Status: DC | PRN

## 2012-05-13 NOTE — Telephone Encounter (Signed)
Pt requests refill of albuterol for her nebulizer. States she only has 2-3 packs left.  Please advise if ok to send Rx.

## 2012-05-13 NOTE — Telephone Encounter (Signed)
Notified pt. 

## 2012-05-13 NOTE — Telephone Encounter (Signed)
Refill left on pharmacy voicemail, #90 x no refills with note that med should not be due until tomorrow (05/14/12).

## 2012-05-14 MED ORDER — ALBUTEROL SULFATE (2.5 MG/3ML) 0.083% IN NEBU: 2.5000 mg | INHALATION_SOLUTION | Freq: Four times a day (QID) | RESPIRATORY_TRACT | Status: DC | PRN

## 2012-05-14 NOTE — Telephone Encounter (Signed)
rx sent

## 2012-05-18 ENCOUNTER — Other Ambulatory Visit: Payer: Self-pay | Admitting: Neurosurgery

## 2012-05-18 DIAGNOSIS — M549 Dorsalgia, unspecified: Secondary | ICD-10-CM

## 2012-05-18 DIAGNOSIS — M5410 Radiculopathy, site unspecified: Secondary | ICD-10-CM

## 2012-05-20 ENCOUNTER — Telehealth: Payer: Self-pay | Admitting: Family

## 2012-05-20 NOTE — Telephone Encounter (Signed)
Message copied by Sandford Craze on Fri May 20, 2012  5:32 AM ------      Message from: Darral Dash E      Created: Thu May 19, 2012  3:46 PM       Patient seeing Dr Jeral Fruit   February 12 @  12noon      ----- Message -----         From: Sandford Craze, NP         Sent: 05/19/2012  12:13 PM           To: Keith Rake Aheron            pls see phone note I sent you.

## 2012-05-25 ENCOUNTER — Other Ambulatory Visit: Payer: Medicare Other

## 2012-06-07 ENCOUNTER — Telehealth: Payer: Self-pay | Admitting: Family

## 2012-06-07 MED ORDER — GABAPENTIN 800 MG PO TABS: ORAL_TABLET | ORAL | Status: DC

## 2012-06-07 NOTE — Telephone Encounter (Signed)
Refill- gabapentin 800mg  tablets. Take one tablet by mouth four times daily. Qty 120 last fill 1.29.14

## 2012-06-07 NOTE — Telephone Encounter (Signed)
Rx sent to pharmacy, #120 x 3 refills.

## 2012-06-09 ENCOUNTER — Telehealth: Payer: Self-pay | Admitting: Family

## 2012-06-09 NOTE — Telephone Encounter (Signed)
Refill- alprazolam 1mg  tablets. Take one tablet 3 times a day as needed. Qty 90 last fill 1.31.14

## 2012-06-10 MED ORDER — ALPRAZOLAM 1 MG PO TABS: 1.0000 mg | ORAL_TABLET | Freq: Three times a day (TID) | ORAL | Status: DC | PRN

## 2012-06-10 NOTE — Telephone Encounter (Signed)
#   90 x no refills called to [facility].

## 2012-06-13 ENCOUNTER — Ambulatory Visit: Payer: Medicare Other | Admitting: Family

## 2012-06-14 ENCOUNTER — Other Ambulatory Visit: Payer: Medicare Other

## 2012-06-15 ENCOUNTER — Telehealth: Payer: Self-pay | Admitting: *Deleted

## 2012-06-15 NOTE — Telephone Encounter (Signed)
Pt left message on 06/14/12 to return her call to discuss getting another doctor?  Left message on home# to return my call.

## 2012-06-20 ENCOUNTER — Other Ambulatory Visit: Payer: Medicare Other

## 2012-06-20 ENCOUNTER — Inpatient Hospital Stay
Admission: RE | Admit: 2012-06-20 | Discharge: 2012-06-20 | Disposition: A | Discharge status: Home / Self Care | Payer: Medicare Other | Source: Ambulatory Visit | Attending: Neurosurgery | Admitting: Neurosurgery

## 2012-06-23 NOTE — Telephone Encounter (Signed)
LMOM [2nd] with contact name & number for return call for more information and/or clarification RE: pt message for "another doctor" [maybe referral]/SLS

## 2012-06-27 NOTE — Telephone Encounter (Signed)
Pt has not returned our calls, will close encounter.

## 2012-06-28 ENCOUNTER — Telehealth: Payer: Self-pay | Admitting: Family

## 2012-06-28 NOTE — Telephone Encounter (Signed)
Refill- bisoprolol/hctz 10mg /6.25mg  tabs. Take one tablet by mouth daily. Qty 30 last fill 10.21.13

## 2012-06-29 MED ORDER — BISOPROLOL-HYDROCHLOROTHIAZIDE 10-6.25 MG PO TABS: ORAL_TABLET | ORAL | Status: DC

## 2012-06-29 NOTE — Telephone Encounter (Signed)
Refills sent

## 2012-06-30 ENCOUNTER — Telehealth: Payer: Self-pay | Admitting: *Deleted

## 2012-06-30 MED ORDER — PERMETHRIN 5 % EX CREA: TOPICAL_CREAM | Freq: Once | CUTANEOUS | Status: DC

## 2012-06-30 NOTE — Telephone Encounter (Signed)
Pt states she has been in contact with a niece that has scabies. Pt now has itchy rash on her leg. Pt is requesting rx that she took before.  Please advise.

## 2012-06-30 NOTE — Telephone Encounter (Signed)
Rx sent for permethrin.  Needs office visit if symptoms do not resolve after rx.

## 2012-07-01 NOTE — Telephone Encounter (Signed)
Rx faxed to pharmacy on 06/30/12, notified pt today.

## 2012-07-06 ENCOUNTER — Telehealth: Payer: Self-pay | Admitting: Family

## 2012-07-06 MED ORDER — ALPRAZOLAM 1 MG PO TABS: 1.0000 mg | ORAL_TABLET | Freq: Three times a day (TID) | ORAL | Status: DC | PRN

## 2012-07-06 NOTE — Telephone Encounter (Signed)
Rx last called in 06/10/12 #90 x no refills. Left refill on [facility] voicemail, #90 x no refills.

## 2012-07-06 NOTE — Telephone Encounter (Signed)
Refill- alprazolam 1mg  tablets. Take one tablet three times a day as needed. Qty 90 last fill 2.28.14

## 2012-07-07 ENCOUNTER — Telehealth: Payer: Self-pay | Admitting: *Deleted

## 2012-07-07 MED ORDER — BISOPROLOL-HYDROCHLOROTHIAZIDE 10-6.25 MG PO TABS: ORAL_TABLET | ORAL | Status: DC

## 2012-07-07 MED ORDER — TIOTROPIUM BROMIDE MONOHYDRATE 18 MCG IN CAPS: 18.0000 ug | ORAL_CAPSULE | Freq: Every day | RESPIRATORY_TRACT | Status: DC
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
## 2012-07-07 NOTE — Telephone Encounter (Signed)
No early refill on alprazolam per controlled substance contract.

## 2012-07-07 NOTE — Telephone Encounter (Signed)
Pt aware.

## 2012-08-04 ENCOUNTER — Telehealth: Payer: Self-pay | Admitting: Family

## 2012-08-04 MED ORDER — ALPRAZOLAM 1 MG PO TABS: 1.0000 mg | ORAL_TABLET | Freq: Three times a day (TID) | ORAL | Status: DC | PRN

## 2012-08-04 NOTE — Telephone Encounter (Signed)
Ok to send 90 no refill.

## 2012-08-04 NOTE — Telephone Encounter (Signed)
Please advise 

## 2012-08-04 NOTE — Telephone Encounter (Signed)
Patient called,        Chisty    Can- you send this rx to pharmacy, Efraim Kaufmann has approved

## 2012-08-04 NOTE — Telephone Encounter (Signed)
Refill- alprazolam 1mg  tablets. Take one tablet by mouth three times daily as needed. Qty 90 last fill 3.26.14

## 2012-08-09 ENCOUNTER — Telehealth: Payer: Self-pay | Admitting: Family

## 2012-08-09 MED ORDER — ALBUTEROL SULFATE (2.5 MG/3ML) 0.083% IN NEBU: 2.5000 mg | INHALATION_SOLUTION | Freq: Four times a day (QID) | RESPIRATORY_TRACT | Status: AC | PRN

## 2012-08-09 NOTE — Telephone Encounter (Signed)
Refill sent.

## 2012-08-09 NOTE — Telephone Encounter (Signed)
Refill- albuterol 0.083%(2.5mg /23ml) 25X49ml. Use one vial by nebulizer every 6 hours as needed for wheezing. Qty 150 last fill 3.28.14

## 2012-08-31 ENCOUNTER — Other Ambulatory Visit: Payer: Self-pay | Admitting: Family

## 2012-08-31 NOTE — Telephone Encounter (Signed)
OK #90 no refills.  

## 2012-08-31 NOTE — Telephone Encounter (Signed)
Refill- alprazolam 1mg  tablets. Take one tablet by mouth three times daily. Qty 90 last fill 4.24.14

## 2012-09-01 MED ORDER — ALPRAZOLAM 1 MG PO TABS: 1.0000 mg | ORAL_TABLET | Freq: Three times a day (TID) | ORAL | Status: DC | PRN

## 2012-09-01 NOTE — Telephone Encounter (Signed)
Rx phoned into pharmacy.

## 2012-09-01 NOTE — Addendum Note (Signed)
Addended by: Marlene Lard on: 09/01/2012 09:30 AM   Modules accepted: Orders

## 2012-09-07 ENCOUNTER — Other Ambulatory Visit: Payer: Self-pay | Admitting: Family

## 2012-09-07 NOTE — Telephone Encounter (Signed)
Refill request for gabapentin Last filled by MD on -07/05/12 #120 x0 Last seen-04/26/12 F/U-06/07/12 #120 x Please advise?

## 2012-09-07 NOTE — Telephone Encounter (Signed)
Make sure she is taking properly.  OK to send 30 day supply with 2 refills if necessary.

## 2012-09-07 NOTE — Telephone Encounter (Signed)
Refill-gabapentin 800mg  tablets. Take one tablet by mouth four times daily. Qty 120 last fill 3.25.14

## 2012-09-07 NOTE — Telephone Encounter (Signed)
Spoke with pharmacist at [facility], she states that pt was given a 90 day supply on 07/05/12 that should last her through June 25. Notified pt and she states she will check her medicine cabinet again but thinks she is out of the gabapentin. Please advise.

## 2012-09-08 MED ORDER — GABAPENTIN 800 MG PO TABS: ORAL_TABLET | ORAL | Status: DC

## 2012-09-08 NOTE — Telephone Encounter (Signed)
Refill request for gabapentin Last filled by MD on -06/07/12 #120 x3 Last seen-04/26/12 F/U-none Please advise refill?

## 2012-09-23 ENCOUNTER — Telehealth: Payer: Self-pay | Admitting: *Deleted

## 2012-09-23 NOTE — Telephone Encounter (Signed)
Received fax from [facility] re: overnight oximetry order. Left detailed message at [phone] ext [phone] that we have no record of ordering this test and Provider states she did not order test and to call us Monday if further questions.

## 2012-09-29 ENCOUNTER — Other Ambulatory Visit: Payer: Self-pay | Admitting: Family

## 2012-09-29 NOTE — Telephone Encounter (Signed)
Ok to send #90 zero refills.  

## 2012-09-30 NOTE — Telephone Encounter (Signed)
Rx request phoned to pharmacy; PATIENT DUE FOR FOLLOW-UP OFFICE VISIT/SLS  

## 2012-10-06 ENCOUNTER — Telehealth: Payer: Self-pay | Admitting: *Deleted

## 2012-10-06 NOTE — Telephone Encounter (Signed)
Received fax from [facility] requesting order for full face mask w/cushion, tubing, disposable filter, non-disposable filter, headgear, chinstrap and replacement water chamber for humidifier. Signed order faxed to [phone].

## 2012-10-12 ENCOUNTER — Telehealth: Payer: Self-pay | Admitting: Family

## 2012-10-12 NOTE — Telephone Encounter (Signed)
LMOM with contact name and number for return call RE: F/U appointment per provider instructions/SLS

## 2012-10-12 NOTE — Telephone Encounter (Signed)
Please call pt and arrange office visit for follow up on sleep apnea.

## 2012-10-17 NOTE — Telephone Encounter (Signed)
Appointment scheduled for 10/26/12.  °

## 2012-10-26 ENCOUNTER — Ambulatory Visit: Payer: PRIVATE HEALTH INSURANCE | Admitting: Family

## 2012-10-28 ENCOUNTER — Telehealth: Payer: Self-pay | Admitting: *Deleted

## 2012-10-28 MED ORDER — BISOPROLOL-HYDROCHLOROTHIAZIDE 10-6.25 MG PO TABS: ORAL_TABLET | ORAL | Status: DC

## 2012-10-28 NOTE — Telephone Encounter (Signed)
OK to send 2 week supply of alprazolam only.  Needs OV prior to additional refills. She has no showed and cancelled last two apts.

## 2012-10-28 NOTE — Telephone Encounter (Signed)
Received call from pt requesting refills on Ziac and alprazolam. Ziac refill sent.  Alprazolam last sent 09/29/12 #90 x no refilils. Please advise.

## 2012-10-31 MED ORDER — ALPRAZOLAM 1 MG PO TABS: ORAL_TABLET | ORAL | Status: DC

## 2012-10-31 NOTE — Telephone Encounter (Signed)
2 week supply of alprazolam left on pharmacy voicemail. Please call pt to arrange appt within the next 2 weeks.

## 2012-10-31 NOTE — Telephone Encounter (Signed)
Patient has appointment for 11/09/12

## 2012-11-09 ENCOUNTER — Encounter: Payer: Self-pay | Admitting: Family

## 2012-11-09 ENCOUNTER — Ambulatory Visit (INDEPENDENT_AMBULATORY_CARE_PROVIDER_SITE_OTHER): Payer: PRIVATE HEALTH INSURANCE | Admitting: Family

## 2012-11-09 VITALS — BP 120/86 | HR 59 | Temp 99.0°F | Resp 18 | Wt 153.0 lb

## 2012-11-09 DIAGNOSIS — Z5181 Encounter for therapeutic drug level monitoring: Secondary | ICD-10-CM

## 2012-11-09 DIAGNOSIS — J449 Chronic obstructive pulmonary disease, unspecified: Secondary | ICD-10-CM

## 2012-11-09 DIAGNOSIS — I10 Essential (primary) hypertension: Secondary | ICD-10-CM

## 2012-11-09 DIAGNOSIS — F419 Anxiety disorder, unspecified: Secondary | ICD-10-CM

## 2012-11-09 DIAGNOSIS — G4730 Sleep apnea, unspecified: Secondary | ICD-10-CM

## 2012-11-09 DIAGNOSIS — F411 Generalized anxiety disorder: Secondary | ICD-10-CM

## 2012-11-09 MED ORDER — TIOTROPIUM BROMIDE MONOHYDRATE 18 MCG IN CAPS: 18.0000 ug | ORAL_CAPSULE | Freq: Every day | RESPIRATORY_TRACT | Status: DC

## 2012-11-09 MED ORDER — ALPRAZOLAM 1 MG PO TABS: ORAL_TABLET | ORAL | Status: AC

## 2012-11-09 MED ORDER — GABAPENTIN 800 MG PO TABS: ORAL_TABLET | ORAL | Status: DC

## 2012-11-09 NOTE — Patient Instructions (Addendum)
Please call Dr. Abbie Sons office to reschedule your appointment to evaluate Hepatitis C. You will be contacted about your referral to the sleep lab. Please complete lab work prior to leaving. Please schedule a follow up appointment in 3 months.

## 2012-11-09 NOTE — Assessment & Plan Note (Signed)
Has not used CPAP in 5 years. Will send for follow up sleep study.

## 2012-11-09 NOTE — Progress Notes (Signed)
Subjective:    Patient ID: Victoria Pratt, female    DOB: [birth year], 56 y.o.   MRN: [id number]  HPI  OSA- pt reports that her last sleep study was 5 years ago. She reports that this was done at [facility].  She reports weight is similar now. She has had a CPAP in the past. She has not used her CPAP in 5 years.  She reports no daytime somnolence.  Reports some snoring. Wakes up feeling unrested.  Has trouble sleeping.  Mind does not want to slow down.   COPD-  Reports that she has cut down with nicotine patches.  Now down to 5 cigarettes a day.    Hep C- she cancelled apt with infectious disease due to transportation problems.  She plans to reschedule.   Review of Systems See HPI  Past Medical History  Diagnosis Date  . Asthma   . Arthritis   . Depression   . Emphysema   . Hyperlipidemia   . Chronic low back pain   . Hypertension   . Anxiety   . COPD (chronic obstructive pulmonary disease)   . Hyperglycemia   . Hepatitis C   . Hepatitis C     History   Social History  . Marital Status: Single    Spouse Name: N/A    Number of Children: 4  . Years of Education: N/A   Occupational History  . Not on file.   Social History Main Topics  . Smoking status: Current Every Day Smoker -- 35 years    Types: Cigarettes  . Smokeless tobacco: Never Used     Comment: Less than 1/2 PPD  . Alcohol Use: No     Comment: remote history of cocaine use.    . Drug Use: No  . Sexually Active: Not on file   Other Topics Concern  . Not on file   Social History Narrative   Regular exercise:  Walks a lot   Caffeine use: 2 cups coffee daily, soda and tea.   She is on disability due to her COPD.   Divorced   4 children (2 daughters and 2 sons)    Past Surgical History  Procedure Laterality Date  . Head surgery  57 yrs old    had plate put in head  . Cesarean section      x 2  . Oophorectomy  1992    right ovary removed due to cyst  . Liver biopsy  04/2008  . Bladder  sling  2009    Family History  Problem Relation Age of Onset  . Arthritis Mother     rheumatoid  . Hypertension Mother   . Heart disease Father   . Arthritis Sister     rheumatoid  . Hypertension Sister   . Cancer Sister     ? in her leg ?    Allergies  Allergen Reactions  . Codeine Swelling    Current Outpatient Prescriptions on File Prior to Visit  Medication Sig Dispense Refill  . albuterol (PROVENTIL HFA;VENTOLIN HFA) 108 (90 BASE) MCG/ACT inhaler Inhale 2 puffs into the lungs daily as needed.  1 Inhaler  2  . albuterol (PROVENTIL) (2.5 MG/3ML) 0.083% nebulizer solution Take 3 mLs (2.5 mg total) by nebulization every 6 (six) hours as needed for wheezing.  150 mL  1  . bisoprolol-hydrochlorothiazide (ZIAC) 10-6.25 MG per tablet TAKE ONE TABLET BY MOUTH DAILY  30 tablet  2  . malathion (OVIDE) 0.5 % lotion  Apply topically once. Sprinkle lotion on dry hair and rub gently until the scalp is thoroughly moistened. Allow to dry naturally and leave uncovered.  59 mL  0  . omega-3 acid ethyl esters (LOVAZA) 1 G capsule Take 4 capsules daily.       Marland Kitchen omeprazole (PRILOSEC) 40 MG capsule Take 1 capsule (40 mg total) by mouth daily.  30 capsule  2   No current facility-administered medications on file prior to visit.    BP 120/86  Pulse 59  Temp(Src) 99 F (37.2 C) (Oral)  Resp 18  Wt 153 lb 0.6 oz (69.418 kg)  SpO2 96%       Objective:   Physical Exam  Constitutional: She is oriented to person, place, and time. She appears well-developed and well-nourished. No distress.  Cardiovascular: Normal rate and regular rhythm.   No murmur heard. Pulmonary/Chest:  + expiratory wheeze  Musculoskeletal: She exhibits no edema.  Neurological: She is alert and oriented to person, place, and time.  Psychiatric: She has a normal mood and affect. Her behavior is normal. Judgment and thought content normal.          Assessment & Plan:

## 2012-11-10 LAB — BASIC METABOLIC PANEL
BUN: 8 mg/dL (ref 6–23)
CO2: 30 mEq/L (ref 19–32)
Chloride: 102 mEq/L (ref 96–112)
Creat: 0.77 mg/dL (ref 0.50–1.10)
Potassium: 4.3 mEq/L (ref 3.5–5.3)

## 2012-11-10 LAB — DRUG SCREEN URINE W/ALC, PAIN MGMT, REFLEX
Barbiturate Quant, Ur: NEGATIVE
Ethyl Alcohol: <10 mg/dL (ref <10)
Marijuana Metabolite: NEGATIVE
Opiates: NEGATIVE
Propoxyphene: NEGATIVE

## 2012-11-11 ENCOUNTER — Telehealth: Payer: Self-pay | Admitting: Family

## 2012-11-11 NOTE — Assessment & Plan Note (Signed)
She continues to smoke, I urged complete cessation.

## 2012-11-11 NOTE — Assessment & Plan Note (Signed)
I have asked pt to schedule follow up with Dr. Wyvonnia Lora (ID).

## 2012-11-11 NOTE — Telephone Encounter (Signed)
Notified pt. She states that she did not take the medication for a couple of days prior to her appt. Advised pt that she was given a 2 week supply of alprazolam that should have lasted her through her next appt. Pt stated that it was originally prescribed to help her sleep and since she has been sleeping well she had not been taking the medication but also notes that sometimes she has to take more due to her anxiety. Upon  Review of refill history, pt has been refilling the medication every 30 days on a consistent basis and requested early refills on several occasions.  Advised pt decision stands unless Provider instructs differently.

## 2012-11-11 NOTE — Telephone Encounter (Signed)
Please contact pt and let her know that her urine drug tox is negative for benzos.  This is in violation of her controlled substance contract.  She will not receive any further controlled substances from our office.

## 2012-11-11 NOTE — Telephone Encounter (Signed)
I agree re: no additional refills will be provided.

## 2012-11-11 NOTE — Assessment & Plan Note (Signed)
She is requesting refill on her alprazolam. Will order routine urine drug tox.  Pt agreeable.

## 2012-12-09 ENCOUNTER — Other Ambulatory Visit: Payer: Self-pay | Admitting: Family

## 2012-12-10 ENCOUNTER — Other Ambulatory Visit: Payer: Self-pay | Admitting: Family

## 2012-12-13 ENCOUNTER — Other Ambulatory Visit: Payer: Self-pay | Admitting: Family

## 2012-12-13 ENCOUNTER — Telehealth: Payer: Self-pay | Admitting: *Deleted

## 2012-12-13 ENCOUNTER — Ambulatory Visit (HOSPITAL_BASED_OUTPATIENT_CLINIC_OR_DEPARTMENT_OTHER): Payer: PRIVATE HEALTH INSURANCE | Attending: Family

## 2012-12-13 NOTE — Telephone Encounter (Signed)
Requesting Xanax 1mg -Pt states she had a death in her family and she not dealing with it good. Last refill:11-09-12 Last OV:11-09-12 Please advise.//AB/CMA

## 2012-12-14 NOTE — Telephone Encounter (Signed)
No further benzos will be prescribed by this office due to negative urine drug screen last visit. This is in violation of the controlled substance contract that she signed.

## 2012-12-15 NOTE — Telephone Encounter (Signed)
No further xanax refills from this office due to break of controlled substance contract.

## 2012-12-15 NOTE — Telephone Encounter (Signed)
Notified pt. She is upset and stated that she has been on this medication for 16 years and would need to be weaned off if we were no longer prescribing it for her. Advised pt that per last drug screen test was negative for xanax which violates controlled substance contract and no exception can be made. Asked pt if she would like a referral to a therapist and she stated no, "she didn't need therapy, she needed her medication."  Advised pt we cannot make exception and she voices understanding but remains upset.

## 2012-12-15 NOTE — Telephone Encounter (Signed)
Refill request for Xanax Last filled by MD on - 11/09/12 #90 x0 Last Seen- 11/09/12 Next Appt: none Please advise refill?

## 2012-12-15 NOTE — Telephone Encounter (Signed)
Pt was notified. See 12/13/12 phone note.

## 2012-12-23 ENCOUNTER — Encounter: Payer: Self-pay | Admitting: Family

## 2012-12-26 ENCOUNTER — Telehealth: Payer: Self-pay | Admitting: *Deleted

## 2012-12-26 DIAGNOSIS — F411 Generalized anxiety disorder: Secondary | ICD-10-CM

## 2012-12-26 NOTE — Telephone Encounter (Signed)
Received call from pt requesting referral to see someone for her anxiety. Reports that she is having a hard time coping with her anxiety without any medication. States she is unable to sleep. Please advise.

## 2012-12-26 NOTE — Telephone Encounter (Signed)
Will refer to psychiatry

## 2013-01-03 ENCOUNTER — Telehealth: Payer: Self-pay | Admitting: Family

## 2013-01-03 NOTE — Telephone Encounter (Signed)
Dismissal Letter sent by Certified Mail 01/03/2013  Dismissal Letter returned Unclaimed 02/10/2013  Dismissal Letter sent by 1st Class Mail to [address], [address], [place] 02/13/2013

## 2013-01-12 ENCOUNTER — Telehealth: Payer: Self-pay | Admitting: *Deleted

## 2013-01-12 NOTE — Telephone Encounter (Signed)
Received message from pt on 01/10/13 requesting return call. States she is out of town visiting her grandhchildren and was notified by her sister that there is a Psychiatric nurse from [place] awaiting her at the post office. Pt is wanting to know the nature of the letter. Attempted to reach pt at [phone] and received message that voice mailbox is full.

## 2013-01-16 ENCOUNTER — Other Ambulatory Visit: Payer: Self-pay | Admitting: Family

## 2013-01-19 ENCOUNTER — Telehealth: Payer: Self-pay | Admitting: *Deleted

## 2013-01-19 NOTE — Telephone Encounter (Signed)
Patient called and requested call back from office [observed that pt has been dismissed from practice]; please advise/SLS

## 2013-01-25 NOTE — Telephone Encounter (Signed)
Returned pt's call. She states she was out of town when she was notified of certified letter delivery. Pt states when she returned the Post Office told her they had returned the letter and she was wanting to know what it was regarding. Advised pt she was dismissed from practice due to breach of controlled substance contract. Pt voices understanding.

## 2013-02-01 ENCOUNTER — Ambulatory Visit (HOSPITAL_COMMUNITY): Payer: PRIVATE HEALTH INSURANCE | Admitting: Psychiatry

## 2013-02-08 ENCOUNTER — Telehealth: Payer: Self-pay | Admitting: *Deleted

## 2013-02-08 MED ORDER — GABAPENTIN 800 MG PO TABS: ORAL_TABLET | ORAL | Status: AC

## 2013-02-08 MED ORDER — TIOTROPIUM BROMIDE MONOHYDRATE 18 MCG IN CAPS: 18.0000 ug | ORAL_CAPSULE | Freq: Every day | RESPIRATORY_TRACT | Status: AC

## 2013-02-08 MED ORDER — BISOPROLOL-HYDROCHLOROTHIAZIDE 10-6.25 MG PO TABS: ORAL_TABLET | ORAL | Status: AC

## 2013-02-08 NOTE — Telephone Encounter (Signed)
Pt left message requesting refills of gabapentin, spiriva and ziac. Pt states she is not yet established with new Provider. 30 day supply of each sent to pharmacy. No further refills can be provided, pt has been made aware.

## 2013-02-14 ENCOUNTER — Ambulatory Visit: Payer: PRIVATE HEALTH INSURANCE

## 2013-04-21 ENCOUNTER — Other Ambulatory Visit: Payer: Self-pay | Admitting: Family

## 2013-04-24 NOTE — Telephone Encounter (Signed)
Patient no longer under provider care; Rx request Denied/SLS

## 2013-08-28 ENCOUNTER — Telehealth: Payer: Self-pay | Admitting: *Deleted

## 2013-08-28 NOTE — Telephone Encounter (Signed)
Received message requesting status of Duoneb order. Spoke with Leila and advised her that pt has not been under our care since the middle of 2014 and authorization cannot be given.

## 2014-04-17 DIAGNOSIS — R7309 Other abnormal glucose: Secondary | ICD-10-CM | POA: Diagnosis not present

## 2014-04-17 DIAGNOSIS — J449 Chronic obstructive pulmonary disease, unspecified: Secondary | ICD-10-CM | POA: Diagnosis not present

## 2014-04-17 DIAGNOSIS — F419 Anxiety disorder, unspecified: Secondary | ICD-10-CM | POA: Diagnosis not present

## 2014-04-17 DIAGNOSIS — I10 Essential (primary) hypertension: Secondary | ICD-10-CM | POA: Diagnosis not present

## 2014-04-17 DIAGNOSIS — K219 Gastro-esophageal reflux disease without esophagitis: Secondary | ICD-10-CM | POA: Diagnosis not present

## 2014-04-17 DIAGNOSIS — M549 Dorsalgia, unspecified: Secondary | ICD-10-CM | POA: Diagnosis not present

## 2014-04-17 DIAGNOSIS — R0602 Shortness of breath: Secondary | ICD-10-CM | POA: Diagnosis not present

## 2014-04-17 DIAGNOSIS — Z720 Tobacco use: Secondary | ICD-10-CM | POA: Diagnosis not present

## 2014-05-01 DIAGNOSIS — F419 Anxiety disorder, unspecified: Secondary | ICD-10-CM | POA: Diagnosis not present

## 2014-05-01 DIAGNOSIS — K219 Gastro-esophageal reflux disease without esophagitis: Secondary | ICD-10-CM | POA: Diagnosis not present

## 2014-05-01 DIAGNOSIS — J449 Chronic obstructive pulmonary disease, unspecified: Secondary | ICD-10-CM | POA: Diagnosis not present

## 2014-05-01 DIAGNOSIS — Z720 Tobacco use: Secondary | ICD-10-CM | POA: Diagnosis not present

## 2014-05-01 DIAGNOSIS — M549 Dorsalgia, unspecified: Secondary | ICD-10-CM | POA: Diagnosis not present

## 2014-05-18 DIAGNOSIS — R0602 Shortness of breath: Secondary | ICD-10-CM | POA: Diagnosis not present

## 2014-05-18 DIAGNOSIS — J449 Chronic obstructive pulmonary disease, unspecified: Secondary | ICD-10-CM | POA: Diagnosis not present

## 2014-05-22 DIAGNOSIS — J449 Chronic obstructive pulmonary disease, unspecified: Secondary | ICD-10-CM | POA: Diagnosis not present

## 2014-06-04 DIAGNOSIS — M549 Dorsalgia, unspecified: Secondary | ICD-10-CM | POA: Diagnosis not present

## 2014-06-04 DIAGNOSIS — F419 Anxiety disorder, unspecified: Secondary | ICD-10-CM | POA: Diagnosis not present

## 2014-06-04 DIAGNOSIS — I10 Essential (primary) hypertension: Secondary | ICD-10-CM | POA: Diagnosis not present

## 2014-06-04 DIAGNOSIS — Z720 Tobacco use: Secondary | ICD-10-CM | POA: Diagnosis not present

## 2014-06-04 DIAGNOSIS — R7309 Other abnormal glucose: Secondary | ICD-10-CM | POA: Diagnosis not present

## 2014-06-04 DIAGNOSIS — E559 Vitamin D deficiency, unspecified: Secondary | ICD-10-CM | POA: Diagnosis not present

## 2014-06-07 DIAGNOSIS — R7309 Other abnormal glucose: Secondary | ICD-10-CM | POA: Diagnosis not present

## 2014-06-07 DIAGNOSIS — Z720 Tobacco use: Secondary | ICD-10-CM | POA: Diagnosis not present

## 2014-06-07 DIAGNOSIS — I10 Essential (primary) hypertension: Secondary | ICD-10-CM | POA: Diagnosis not present

## 2014-06-07 DIAGNOSIS — M549 Dorsalgia, unspecified: Secondary | ICD-10-CM | POA: Diagnosis not present

## 2014-06-07 DIAGNOSIS — F419 Anxiety disorder, unspecified: Secondary | ICD-10-CM | POA: Diagnosis not present

## 2014-06-15 DIAGNOSIS — J449 Chronic obstructive pulmonary disease, unspecified: Secondary | ICD-10-CM | POA: Diagnosis not present

## 2014-06-16 DIAGNOSIS — J449 Chronic obstructive pulmonary disease, unspecified: Secondary | ICD-10-CM | POA: Diagnosis not present

## 2014-06-16 DIAGNOSIS — R0602 Shortness of breath: Secondary | ICD-10-CM | POA: Diagnosis not present

## 2014-06-29 DIAGNOSIS — I10 Essential (primary) hypertension: Secondary | ICD-10-CM | POA: Diagnosis not present

## 2014-06-29 DIAGNOSIS — Z720 Tobacco use: Secondary | ICD-10-CM | POA: Diagnosis not present

## 2014-06-29 DIAGNOSIS — M545 Low back pain: Secondary | ICD-10-CM | POA: Diagnosis not present

## 2014-06-29 DIAGNOSIS — R7309 Other abnormal glucose: Secondary | ICD-10-CM | POA: Diagnosis not present

## 2014-06-29 DIAGNOSIS — F419 Anxiety disorder, unspecified: Secondary | ICD-10-CM | POA: Diagnosis not present

## 2014-07-17 DIAGNOSIS — R0602 Shortness of breath: Secondary | ICD-10-CM | POA: Diagnosis not present

## 2014-07-17 DIAGNOSIS — J449 Chronic obstructive pulmonary disease, unspecified: Secondary | ICD-10-CM | POA: Diagnosis not present

## 2014-07-18 DIAGNOSIS — J449 Chronic obstructive pulmonary disease, unspecified: Secondary | ICD-10-CM | POA: Diagnosis not present

## 2014-07-26 DIAGNOSIS — M545 Low back pain: Secondary | ICD-10-CM | POA: Diagnosis not present

## 2014-07-26 DIAGNOSIS — I10 Essential (primary) hypertension: Secondary | ICD-10-CM | POA: Diagnosis not present

## 2014-07-26 DIAGNOSIS — R7309 Other abnormal glucose: Secondary | ICD-10-CM | POA: Diagnosis not present

## 2014-07-26 DIAGNOSIS — Z720 Tobacco use: Secondary | ICD-10-CM | POA: Diagnosis not present

## 2014-07-26 DIAGNOSIS — F419 Anxiety disorder, unspecified: Secondary | ICD-10-CM | POA: Diagnosis not present

## 2014-08-16 DIAGNOSIS — R0602 Shortness of breath: Secondary | ICD-10-CM | POA: Diagnosis not present

## 2014-08-16 DIAGNOSIS — J449 Chronic obstructive pulmonary disease, unspecified: Secondary | ICD-10-CM | POA: Diagnosis not present

## 2014-08-24 DIAGNOSIS — J449 Chronic obstructive pulmonary disease, unspecified: Secondary | ICD-10-CM | POA: Diagnosis not present

## 2014-09-18 DIAGNOSIS — J449 Chronic obstructive pulmonary disease, unspecified: Secondary | ICD-10-CM | POA: Diagnosis not present

## 2014-10-09 DIAGNOSIS — I10 Essential (primary) hypertension: Secondary | ICD-10-CM | POA: Diagnosis not present

## 2014-10-09 DIAGNOSIS — M545 Low back pain: Secondary | ICD-10-CM | POA: Diagnosis not present

## 2014-10-09 DIAGNOSIS — M2550 Pain in unspecified joint: Secondary | ICD-10-CM | POA: Diagnosis not present

## 2014-10-09 DIAGNOSIS — Z720 Tobacco use: Secondary | ICD-10-CM | POA: Diagnosis not present

## 2014-10-09 DIAGNOSIS — F419 Anxiety disorder, unspecified: Secondary | ICD-10-CM | POA: Diagnosis not present

## 2014-10-16 DIAGNOSIS — J449 Chronic obstructive pulmonary disease, unspecified: Secondary | ICD-10-CM | POA: Diagnosis not present

## 2014-10-16 DIAGNOSIS — R0602 Shortness of breath: Secondary | ICD-10-CM | POA: Diagnosis not present

## 2014-10-19 DIAGNOSIS — J449 Chronic obstructive pulmonary disease, unspecified: Secondary | ICD-10-CM | POA: Diagnosis not present

## 2014-11-13 DIAGNOSIS — J449 Chronic obstructive pulmonary disease, unspecified: Secondary | ICD-10-CM | POA: Diagnosis not present

## 2014-12-10 DIAGNOSIS — J449 Chronic obstructive pulmonary disease, unspecified: Secondary | ICD-10-CM | POA: Diagnosis not present

## 2014-12-17 DIAGNOSIS — R0602 Shortness of breath: Secondary | ICD-10-CM | POA: Diagnosis not present

## 2014-12-17 DIAGNOSIS — J449 Chronic obstructive pulmonary disease, unspecified: Secondary | ICD-10-CM | POA: Diagnosis not present

## 2014-12-31 DIAGNOSIS — G603 Idiopathic progressive neuropathy: Secondary | ICD-10-CM | POA: Diagnosis not present

## 2014-12-31 DIAGNOSIS — F1120 Opioid dependence, uncomplicated: Secondary | ICD-10-CM | POA: Diagnosis not present

## 2015-01-16 DIAGNOSIS — R0602 Shortness of breath: Secondary | ICD-10-CM | POA: Diagnosis not present

## 2015-01-16 DIAGNOSIS — J449 Chronic obstructive pulmonary disease, unspecified: Secondary | ICD-10-CM | POA: Diagnosis not present

## 2015-01-22 DIAGNOSIS — I10 Essential (primary) hypertension: Secondary | ICD-10-CM | POA: Diagnosis not present

## 2015-01-22 DIAGNOSIS — Z7689 Persons encountering health services in other specified circumstances: Secondary | ICD-10-CM | POA: Diagnosis not present

## 2015-01-22 DIAGNOSIS — F419 Anxiety disorder, unspecified: Secondary | ICD-10-CM | POA: Diagnosis not present

## 2015-01-22 DIAGNOSIS — R7309 Other abnormal glucose: Secondary | ICD-10-CM | POA: Diagnosis not present

## 2015-01-22 DIAGNOSIS — M545 Low back pain: Secondary | ICD-10-CM | POA: Diagnosis not present

## 2015-01-22 DIAGNOSIS — Z5181 Encounter for therapeutic drug level monitoring: Secondary | ICD-10-CM | POA: Diagnosis not present

## 2015-01-22 DIAGNOSIS — Z23 Encounter for immunization: Secondary | ICD-10-CM | POA: Diagnosis not present

## 2015-01-22 DIAGNOSIS — Z720 Tobacco use: Secondary | ICD-10-CM | POA: Diagnosis not present

## 2015-02-16 DIAGNOSIS — J449 Chronic obstructive pulmonary disease, unspecified: Secondary | ICD-10-CM | POA: Diagnosis not present

## 2015-02-16 DIAGNOSIS — R0602 Shortness of breath: Secondary | ICD-10-CM | POA: Diagnosis not present

## 2015-03-18 DIAGNOSIS — J449 Chronic obstructive pulmonary disease, unspecified: Secondary | ICD-10-CM | POA: Diagnosis not present

## 2015-03-18 DIAGNOSIS — R0602 Shortness of breath: Secondary | ICD-10-CM | POA: Diagnosis not present

## 2015-04-18 DIAGNOSIS — J449 Chronic obstructive pulmonary disease, unspecified: Secondary | ICD-10-CM | POA: Diagnosis not present

## 2015-04-18 DIAGNOSIS — R0602 Shortness of breath: Secondary | ICD-10-CM | POA: Diagnosis not present

## 2015-05-09 DIAGNOSIS — M545 Low back pain: Secondary | ICD-10-CM | POA: Diagnosis not present

## 2015-05-09 DIAGNOSIS — R7301 Impaired fasting glucose: Secondary | ICD-10-CM | POA: Diagnosis not present

## 2015-05-09 DIAGNOSIS — R739 Hyperglycemia, unspecified: Secondary | ICD-10-CM | POA: Diagnosis not present

## 2015-05-09 DIAGNOSIS — I10 Essential (primary) hypertension: Secondary | ICD-10-CM | POA: Diagnosis not present

## 2015-05-09 DIAGNOSIS — Z5181 Encounter for therapeutic drug level monitoring: Secondary | ICD-10-CM | POA: Diagnosis not present

## 2015-05-09 DIAGNOSIS — M2550 Pain in unspecified joint: Secondary | ICD-10-CM | POA: Diagnosis not present

## 2015-05-09 DIAGNOSIS — Z720 Tobacco use: Secondary | ICD-10-CM | POA: Diagnosis not present

## 2015-05-09 DIAGNOSIS — Z7689 Persons encountering health services in other specified circumstances: Secondary | ICD-10-CM | POA: Diagnosis not present

## 2015-05-19 DIAGNOSIS — J449 Chronic obstructive pulmonary disease, unspecified: Secondary | ICD-10-CM | POA: Diagnosis not present

## 2015-05-19 DIAGNOSIS — R0602 Shortness of breath: Secondary | ICD-10-CM | POA: Diagnosis not present

## 2015-06-11 DIAGNOSIS — R739 Hyperglycemia, unspecified: Secondary | ICD-10-CM | POA: Diagnosis not present

## 2015-06-11 DIAGNOSIS — M2550 Pain in unspecified joint: Secondary | ICD-10-CM | POA: Diagnosis not present

## 2015-06-11 DIAGNOSIS — I10 Essential (primary) hypertension: Secondary | ICD-10-CM | POA: Diagnosis not present

## 2015-06-11 DIAGNOSIS — M545 Low back pain: Secondary | ICD-10-CM | POA: Diagnosis not present

## 2015-06-11 DIAGNOSIS — Z720 Tobacco use: Secondary | ICD-10-CM | POA: Diagnosis not present

## 2015-06-16 DIAGNOSIS — J449 Chronic obstructive pulmonary disease, unspecified: Secondary | ICD-10-CM | POA: Diagnosis not present

## 2015-06-16 DIAGNOSIS — R0602 Shortness of breath: Secondary | ICD-10-CM | POA: Diagnosis not present

## 2015-07-17 DIAGNOSIS — J449 Chronic obstructive pulmonary disease, unspecified: Secondary | ICD-10-CM | POA: Diagnosis not present

## 2015-07-17 DIAGNOSIS — R0602 Shortness of breath: Secondary | ICD-10-CM | POA: Diagnosis not present

## 2015-07-26 DIAGNOSIS — K8020 Calculus of gallbladder without cholecystitis without obstruction: Secondary | ICD-10-CM | POA: Diagnosis not present

## 2015-07-26 DIAGNOSIS — I10 Essential (primary) hypertension: Secondary | ICD-10-CM | POA: Diagnosis not present

## 2015-07-26 DIAGNOSIS — Z885 Allergy status to narcotic agent status: Secondary | ICD-10-CM | POA: Diagnosis not present

## 2015-07-26 DIAGNOSIS — M47816 Spondylosis without myelopathy or radiculopathy, lumbar region: Secondary | ICD-10-CM | POA: Diagnosis not present

## 2015-07-26 DIAGNOSIS — M4310 Spondylolisthesis, site unspecified: Secondary | ICD-10-CM | POA: Diagnosis not present

## 2015-07-26 DIAGNOSIS — J449 Chronic obstructive pulmonary disease, unspecified: Secondary | ICD-10-CM | POA: Diagnosis not present

## 2015-07-26 DIAGNOSIS — R1032 Left lower quadrant pain: Secondary | ICD-10-CM | POA: Diagnosis not present

## 2015-07-26 DIAGNOSIS — K449 Diaphragmatic hernia without obstruction or gangrene: Secondary | ICD-10-CM | POA: Diagnosis not present

## 2015-07-26 DIAGNOSIS — N134 Hydroureter: Secondary | ICD-10-CM | POA: Diagnosis not present

## 2015-07-26 DIAGNOSIS — N132 Hydronephrosis with renal and ureteral calculous obstruction: Secondary | ICD-10-CM | POA: Diagnosis not present

## 2015-07-26 DIAGNOSIS — R112 Nausea with vomiting, unspecified: Secondary | ICD-10-CM | POA: Diagnosis not present

## 2015-07-26 DIAGNOSIS — N201 Calculus of ureter: Secondary | ICD-10-CM | POA: Diagnosis not present

## 2015-08-16 DIAGNOSIS — N390 Urinary tract infection, site not specified: Secondary | ICD-10-CM | POA: Diagnosis not present

## 2015-08-16 DIAGNOSIS — Z5181 Encounter for therapeutic drug level monitoring: Secondary | ICD-10-CM | POA: Diagnosis not present

## 2015-08-16 DIAGNOSIS — J449 Chronic obstructive pulmonary disease, unspecified: Secondary | ICD-10-CM | POA: Diagnosis not present

## 2015-08-16 DIAGNOSIS — I10 Essential (primary) hypertension: Secondary | ICD-10-CM | POA: Diagnosis not present

## 2015-08-16 DIAGNOSIS — Z720 Tobacco use: Secondary | ICD-10-CM | POA: Diagnosis not present

## 2015-08-16 DIAGNOSIS — R0602 Shortness of breath: Secondary | ICD-10-CM | POA: Diagnosis not present

## 2015-08-16 DIAGNOSIS — N200 Calculus of kidney: Secondary | ICD-10-CM | POA: Diagnosis not present

## 2015-09-16 DIAGNOSIS — J449 Chronic obstructive pulmonary disease, unspecified: Secondary | ICD-10-CM | POA: Diagnosis not present

## 2015-09-16 DIAGNOSIS — R0602 Shortness of breath: Secondary | ICD-10-CM | POA: Diagnosis not present

## 2015-10-16 DIAGNOSIS — J449 Chronic obstructive pulmonary disease, unspecified: Secondary | ICD-10-CM | POA: Diagnosis not present

## 2015-10-16 DIAGNOSIS — R0602 Shortness of breath: Secondary | ICD-10-CM | POA: Diagnosis not present

## 2015-11-16 DIAGNOSIS — R0602 Shortness of breath: Secondary | ICD-10-CM | POA: Diagnosis not present

## 2015-11-16 DIAGNOSIS — J449 Chronic obstructive pulmonary disease, unspecified: Secondary | ICD-10-CM | POA: Diagnosis not present

## 2015-12-17 DIAGNOSIS — R0602 Shortness of breath: Secondary | ICD-10-CM | POA: Diagnosis not present

## 2015-12-17 DIAGNOSIS — J449 Chronic obstructive pulmonary disease, unspecified: Secondary | ICD-10-CM | POA: Diagnosis not present

## 2016-01-16 DIAGNOSIS — R0602 Shortness of breath: Secondary | ICD-10-CM | POA: Diagnosis not present

## 2016-01-16 DIAGNOSIS — J449 Chronic obstructive pulmonary disease, unspecified: Secondary | ICD-10-CM | POA: Diagnosis not present

## 2016-02-16 DIAGNOSIS — J449 Chronic obstructive pulmonary disease, unspecified: Secondary | ICD-10-CM | POA: Diagnosis not present

## 2016-02-16 DIAGNOSIS — R0602 Shortness of breath: Secondary | ICD-10-CM | POA: Diagnosis not present

## 2016-03-17 DIAGNOSIS — J449 Chronic obstructive pulmonary disease, unspecified: Secondary | ICD-10-CM | POA: Diagnosis not present

## 2016-03-17 DIAGNOSIS — R0602 Shortness of breath: Secondary | ICD-10-CM | POA: Diagnosis not present

## 2016-04-07 DIAGNOSIS — S0181XA Laceration without foreign body of other part of head, initial encounter: Secondary | ICD-10-CM | POA: Diagnosis not present

## 2016-04-07 DIAGNOSIS — S0990XA Unspecified injury of head, initial encounter: Secondary | ICD-10-CM | POA: Diagnosis not present

## 2016-04-17 DIAGNOSIS — J449 Chronic obstructive pulmonary disease, unspecified: Secondary | ICD-10-CM | POA: Diagnosis not present

## 2016-04-17 DIAGNOSIS — R0602 Shortness of breath: Secondary | ICD-10-CM | POA: Diagnosis not present

## 2016-05-18 DIAGNOSIS — R0602 Shortness of breath: Secondary | ICD-10-CM | POA: Diagnosis not present

## 2016-05-18 DIAGNOSIS — J449 Chronic obstructive pulmonary disease, unspecified: Secondary | ICD-10-CM | POA: Diagnosis not present

## 2016-06-15 DIAGNOSIS — R0602 Shortness of breath: Secondary | ICD-10-CM | POA: Diagnosis not present

## 2016-06-15 DIAGNOSIS — J449 Chronic obstructive pulmonary disease, unspecified: Secondary | ICD-10-CM | POA: Diagnosis not present

## 2016-07-16 DIAGNOSIS — R0602 Shortness of breath: Secondary | ICD-10-CM | POA: Diagnosis not present

## 2016-07-16 DIAGNOSIS — J449 Chronic obstructive pulmonary disease, unspecified: Secondary | ICD-10-CM | POA: Diagnosis not present

## 2016-08-15 DIAGNOSIS — R0602 Shortness of breath: Secondary | ICD-10-CM | POA: Diagnosis not present

## 2016-08-15 DIAGNOSIS — J449 Chronic obstructive pulmonary disease, unspecified: Secondary | ICD-10-CM | POA: Diagnosis not present

## 2016-09-15 DIAGNOSIS — R0602 Shortness of breath: Secondary | ICD-10-CM | POA: Diagnosis not present

## 2016-09-15 DIAGNOSIS — J449 Chronic obstructive pulmonary disease, unspecified: Secondary | ICD-10-CM | POA: Diagnosis not present

## 2016-10-15 DIAGNOSIS — R0602 Shortness of breath: Secondary | ICD-10-CM | POA: Diagnosis not present

## 2016-10-15 DIAGNOSIS — J449 Chronic obstructive pulmonary disease, unspecified: Secondary | ICD-10-CM | POA: Diagnosis not present

## 2016-11-15 DIAGNOSIS — R0602 Shortness of breath: Secondary | ICD-10-CM | POA: Diagnosis not present

## 2016-11-15 DIAGNOSIS — J449 Chronic obstructive pulmonary disease, unspecified: Secondary | ICD-10-CM | POA: Diagnosis not present

## 2016-12-16 DIAGNOSIS — R0602 Shortness of breath: Secondary | ICD-10-CM | POA: Diagnosis not present

## 2016-12-16 DIAGNOSIS — J449 Chronic obstructive pulmonary disease, unspecified: Secondary | ICD-10-CM | POA: Diagnosis not present

## 2017-01-15 DIAGNOSIS — J449 Chronic obstructive pulmonary disease, unspecified: Secondary | ICD-10-CM | POA: Diagnosis not present

## 2017-01-15 DIAGNOSIS — R0602 Shortness of breath: Secondary | ICD-10-CM | POA: Diagnosis not present

## 2017-02-15 DIAGNOSIS — J449 Chronic obstructive pulmonary disease, unspecified: Secondary | ICD-10-CM | POA: Diagnosis not present

## 2017-02-15 DIAGNOSIS — R0602 Shortness of breath: Secondary | ICD-10-CM | POA: Diagnosis not present

## 2017-03-17 DIAGNOSIS — J449 Chronic obstructive pulmonary disease, unspecified: Secondary | ICD-10-CM | POA: Diagnosis not present

## 2017-03-17 DIAGNOSIS — R0602 Shortness of breath: Secondary | ICD-10-CM | POA: Diagnosis not present

## 2017-04-17 DIAGNOSIS — R0602 Shortness of breath: Secondary | ICD-10-CM | POA: Diagnosis not present

## 2017-04-17 DIAGNOSIS — J449 Chronic obstructive pulmonary disease, unspecified: Secondary | ICD-10-CM | POA: Diagnosis not present

## 2017-05-18 DIAGNOSIS — J449 Chronic obstructive pulmonary disease, unspecified: Secondary | ICD-10-CM | POA: Diagnosis not present

## 2017-05-18 DIAGNOSIS — R0602 Shortness of breath: Secondary | ICD-10-CM | POA: Diagnosis not present

## 2017-06-15 DIAGNOSIS — R0602 Shortness of breath: Secondary | ICD-10-CM | POA: Diagnosis not present

## 2017-06-15 DIAGNOSIS — J449 Chronic obstructive pulmonary disease, unspecified: Secondary | ICD-10-CM | POA: Diagnosis not present

## 2017-07-16 DIAGNOSIS — R0602 Shortness of breath: Secondary | ICD-10-CM | POA: Diagnosis not present

## 2017-07-16 DIAGNOSIS — J449 Chronic obstructive pulmonary disease, unspecified: Secondary | ICD-10-CM | POA: Diagnosis not present

## 2017-08-15 DIAGNOSIS — J449 Chronic obstructive pulmonary disease, unspecified: Secondary | ICD-10-CM | POA: Diagnosis not present

## 2017-08-15 DIAGNOSIS — R0602 Shortness of breath: Secondary | ICD-10-CM | POA: Diagnosis not present

## 2017-09-15 DIAGNOSIS — J449 Chronic obstructive pulmonary disease, unspecified: Secondary | ICD-10-CM | POA: Diagnosis not present

## 2017-09-15 DIAGNOSIS — R0602 Shortness of breath: Secondary | ICD-10-CM | POA: Diagnosis not present

## 2017-10-15 DIAGNOSIS — J449 Chronic obstructive pulmonary disease, unspecified: Secondary | ICD-10-CM | POA: Diagnosis not present

## 2017-10-15 DIAGNOSIS — R0602 Shortness of breath: Secondary | ICD-10-CM | POA: Diagnosis not present

## 2017-10-31 DIAGNOSIS — L539 Erythematous condition, unspecified: Secondary | ICD-10-CM | POA: Diagnosis not present

## 2017-10-31 DIAGNOSIS — Z885 Allergy status to narcotic agent status: Secondary | ICD-10-CM | POA: Diagnosis not present

## 2017-10-31 DIAGNOSIS — L0231 Cutaneous abscess of buttock: Secondary | ICD-10-CM | POA: Diagnosis not present

## 2017-10-31 DIAGNOSIS — J449 Chronic obstructive pulmonary disease, unspecified: Secondary | ICD-10-CM | POA: Diagnosis not present

## 2017-11-15 DIAGNOSIS — J449 Chronic obstructive pulmonary disease, unspecified: Secondary | ICD-10-CM | POA: Diagnosis not present

## 2017-11-15 DIAGNOSIS — R0602 Shortness of breath: Secondary | ICD-10-CM | POA: Diagnosis not present

## 2017-12-16 DIAGNOSIS — R0602 Shortness of breath: Secondary | ICD-10-CM | POA: Diagnosis not present

## 2017-12-16 DIAGNOSIS — J449 Chronic obstructive pulmonary disease, unspecified: Secondary | ICD-10-CM | POA: Diagnosis not present

## 2018-01-15 DIAGNOSIS — R0602 Shortness of breath: Secondary | ICD-10-CM | POA: Diagnosis not present

## 2018-01-15 DIAGNOSIS — J449 Chronic obstructive pulmonary disease, unspecified: Secondary | ICD-10-CM | POA: Diagnosis not present

## 2018-02-15 DIAGNOSIS — J449 Chronic obstructive pulmonary disease, unspecified: Secondary | ICD-10-CM | POA: Diagnosis not present

## 2018-02-15 DIAGNOSIS — R0602 Shortness of breath: Secondary | ICD-10-CM | POA: Diagnosis not present

## 2018-03-17 DIAGNOSIS — J449 Chronic obstructive pulmonary disease, unspecified: Secondary | ICD-10-CM | POA: Diagnosis not present

## 2018-03-17 DIAGNOSIS — R0602 Shortness of breath: Secondary | ICD-10-CM | POA: Diagnosis not present

## 2018-04-17 DIAGNOSIS — J449 Chronic obstructive pulmonary disease, unspecified: Secondary | ICD-10-CM | POA: Diagnosis not present

## 2018-04-17 DIAGNOSIS — R0602 Shortness of breath: Secondary | ICD-10-CM | POA: Diagnosis not present

## 2018-05-18 DIAGNOSIS — J449 Chronic obstructive pulmonary disease, unspecified: Secondary | ICD-10-CM | POA: Diagnosis not present

## 2018-05-18 DIAGNOSIS — R0602 Shortness of breath: Secondary | ICD-10-CM | POA: Diagnosis not present

## 2018-06-02 DIAGNOSIS — H5213 Myopia, bilateral: Secondary | ICD-10-CM | POA: Diagnosis not present

## 2018-06-16 DIAGNOSIS — R0602 Shortness of breath: Secondary | ICD-10-CM | POA: Diagnosis not present

## 2018-06-16 DIAGNOSIS — J449 Chronic obstructive pulmonary disease, unspecified: Secondary | ICD-10-CM | POA: Diagnosis not present

## 2018-06-24 DIAGNOSIS — M545 Low back pain: Secondary | ICD-10-CM | POA: Diagnosis not present

## 2018-06-24 DIAGNOSIS — Z720 Tobacco use: Secondary | ICD-10-CM | POA: Diagnosis not present

## 2018-06-24 DIAGNOSIS — J449 Chronic obstructive pulmonary disease, unspecified: Secondary | ICD-10-CM | POA: Diagnosis not present

## 2018-06-24 DIAGNOSIS — H5203 Hypermetropia, bilateral: Secondary | ICD-10-CM | POA: Diagnosis not present

## 2018-06-24 DIAGNOSIS — I10 Essential (primary) hypertension: Secondary | ICD-10-CM | POA: Diagnosis not present

## 2018-07-17 DIAGNOSIS — R0602 Shortness of breath: Secondary | ICD-10-CM | POA: Diagnosis not present

## 2018-07-17 DIAGNOSIS — J449 Chronic obstructive pulmonary disease, unspecified: Secondary | ICD-10-CM | POA: Diagnosis not present

## 2018-08-02 DIAGNOSIS — Z720 Tobacco use: Secondary | ICD-10-CM | POA: Diagnosis not present

## 2018-08-02 DIAGNOSIS — M542 Cervicalgia: Secondary | ICD-10-CM | POA: Diagnosis not present

## 2018-08-02 DIAGNOSIS — M5441 Lumbago with sciatica, right side: Secondary | ICD-10-CM | POA: Diagnosis not present

## 2018-08-02 DIAGNOSIS — M545 Low back pain: Secondary | ICD-10-CM | POA: Diagnosis not present

## 2018-08-03 DIAGNOSIS — I10 Essential (primary) hypertension: Secondary | ICD-10-CM | POA: Diagnosis not present

## 2018-08-03 DIAGNOSIS — M545 Low back pain: Secondary | ICD-10-CM | POA: Diagnosis not present

## 2018-08-03 DIAGNOSIS — Z720 Tobacco use: Secondary | ICD-10-CM | POA: Diagnosis not present

## 2018-08-03 DIAGNOSIS — J449 Chronic obstructive pulmonary disease, unspecified: Secondary | ICD-10-CM | POA: Diagnosis not present

## 2018-08-16 DIAGNOSIS — J449 Chronic obstructive pulmonary disease, unspecified: Secondary | ICD-10-CM | POA: Diagnosis not present

## 2018-08-16 DIAGNOSIS — R0602 Shortness of breath: Secondary | ICD-10-CM | POA: Diagnosis not present

## 2018-09-16 DIAGNOSIS — R0602 Shortness of breath: Secondary | ICD-10-CM | POA: Diagnosis not present

## 2018-09-16 DIAGNOSIS — J449 Chronic obstructive pulmonary disease, unspecified: Secondary | ICD-10-CM | POA: Diagnosis not present

## 2018-09-29 DIAGNOSIS — Z01118 Encounter for examination of ears and hearing with other abnormal findings: Secondary | ICD-10-CM | POA: Diagnosis not present

## 2018-09-29 DIAGNOSIS — Z131 Encounter for screening for diabetes mellitus: Secondary | ICD-10-CM | POA: Diagnosis not present

## 2018-09-29 DIAGNOSIS — Z0001 Encounter for general adult medical examination with abnormal findings: Secondary | ICD-10-CM | POA: Diagnosis not present

## 2018-09-29 DIAGNOSIS — J449 Chronic obstructive pulmonary disease, unspecified: Secondary | ICD-10-CM | POA: Diagnosis not present

## 2018-09-29 DIAGNOSIS — Z1389 Encounter for screening for other disorder: Secondary | ICD-10-CM | POA: Diagnosis not present

## 2018-09-29 DIAGNOSIS — R740 Nonspecific elevation of levels of transaminase and lactic acid dehydrogenase [LDH]: Secondary | ICD-10-CM | POA: Diagnosis not present

## 2018-09-29 DIAGNOSIS — Z01021 Encounter for examination of eyes and vision following failed vision screening with abnormal findings: Secondary | ICD-10-CM | POA: Diagnosis not present

## 2018-09-29 DIAGNOSIS — Z5181 Encounter for therapeutic drug level monitoring: Secondary | ICD-10-CM | POA: Diagnosis not present

## 2018-09-29 DIAGNOSIS — I10 Essential (primary) hypertension: Secondary | ICD-10-CM | POA: Diagnosis not present

## 2018-10-16 DIAGNOSIS — J449 Chronic obstructive pulmonary disease, unspecified: Secondary | ICD-10-CM | POA: Diagnosis not present

## 2018-10-16 DIAGNOSIS — R0602 Shortness of breath: Secondary | ICD-10-CM | POA: Diagnosis not present

## 2018-11-16 DIAGNOSIS — J449 Chronic obstructive pulmonary disease, unspecified: Secondary | ICD-10-CM | POA: Diagnosis not present

## 2018-11-16 DIAGNOSIS — R0602 Shortness of breath: Secondary | ICD-10-CM | POA: Diagnosis not present

## 2018-12-17 DIAGNOSIS — J449 Chronic obstructive pulmonary disease, unspecified: Secondary | ICD-10-CM | POA: Diagnosis not present

## 2018-12-17 DIAGNOSIS — R0602 Shortness of breath: Secondary | ICD-10-CM | POA: Diagnosis not present

## 2019-01-16 DIAGNOSIS — R0602 Shortness of breath: Secondary | ICD-10-CM | POA: Diagnosis not present

## 2019-01-16 DIAGNOSIS — J449 Chronic obstructive pulmonary disease, unspecified: Secondary | ICD-10-CM | POA: Diagnosis not present

## 2019-02-01 DIAGNOSIS — M545 Low back pain: Secondary | ICD-10-CM | POA: Diagnosis not present

## 2019-02-09 DIAGNOSIS — J449 Chronic obstructive pulmonary disease, unspecified: Secondary | ICD-10-CM | POA: Diagnosis not present

## 2019-02-09 DIAGNOSIS — I10 Essential (primary) hypertension: Secondary | ICD-10-CM | POA: Diagnosis not present

## 2019-02-09 DIAGNOSIS — N3001 Acute cystitis with hematuria: Secondary | ICD-10-CM | POA: Diagnosis not present

## 2019-02-09 DIAGNOSIS — Z7689 Persons encountering health services in other specified circumstances: Secondary | ICD-10-CM | POA: Diagnosis not present

## 2019-02-09 DIAGNOSIS — K219 Gastro-esophageal reflux disease without esophagitis: Secondary | ICD-10-CM | POA: Diagnosis not present

## 2019-02-16 DIAGNOSIS — J449 Chronic obstructive pulmonary disease, unspecified: Secondary | ICD-10-CM | POA: Diagnosis not present

## 2019-02-16 DIAGNOSIS — R0602 Shortness of breath: Secondary | ICD-10-CM | POA: Diagnosis not present

## 2019-02-21 DIAGNOSIS — I10 Essential (primary) hypertension: Secondary | ICD-10-CM | POA: Diagnosis not present

## 2019-02-21 DIAGNOSIS — K219 Gastro-esophageal reflux disease without esophagitis: Secondary | ICD-10-CM | POA: Diagnosis not present

## 2019-02-21 DIAGNOSIS — J449 Chronic obstructive pulmonary disease, unspecified: Secondary | ICD-10-CM | POA: Diagnosis not present

## 2019-02-21 DIAGNOSIS — R079 Chest pain, unspecified: Secondary | ICD-10-CM | POA: Diagnosis not present

## 2019-03-16 DIAGNOSIS — K219 Gastro-esophageal reflux disease without esophagitis: Secondary | ICD-10-CM | POA: Diagnosis not present

## 2019-03-16 DIAGNOSIS — J449 Chronic obstructive pulmonary disease, unspecified: Secondary | ICD-10-CM | POA: Diagnosis not present

## 2019-03-16 DIAGNOSIS — Z720 Tobacco use: Secondary | ICD-10-CM | POA: Diagnosis not present

## 2019-03-16 DIAGNOSIS — E559 Vitamin D deficiency, unspecified: Secondary | ICD-10-CM | POA: Diagnosis not present

## 2019-03-16 DIAGNOSIS — I10 Essential (primary) hypertension: Secondary | ICD-10-CM | POA: Diagnosis not present

## 2019-03-18 DIAGNOSIS — R0602 Shortness of breath: Secondary | ICD-10-CM | POA: Diagnosis not present

## 2019-03-18 DIAGNOSIS — J449 Chronic obstructive pulmonary disease, unspecified: Secondary | ICD-10-CM | POA: Diagnosis not present

## 2019-04-18 DIAGNOSIS — J449 Chronic obstructive pulmonary disease, unspecified: Secondary | ICD-10-CM | POA: Diagnosis not present

## 2019-04-18 DIAGNOSIS — R0602 Shortness of breath: Secondary | ICD-10-CM | POA: Diagnosis not present

## 2019-05-19 DIAGNOSIS — J449 Chronic obstructive pulmonary disease, unspecified: Secondary | ICD-10-CM | POA: Diagnosis not present

## 2019-05-19 DIAGNOSIS — R0602 Shortness of breath: Secondary | ICD-10-CM | POA: Diagnosis not present

## 2019-06-16 DIAGNOSIS — R0602 Shortness of breath: Secondary | ICD-10-CM | POA: Diagnosis not present

## 2019-06-16 DIAGNOSIS — J449 Chronic obstructive pulmonary disease, unspecified: Secondary | ICD-10-CM | POA: Diagnosis not present

## 2019-07-12 DIAGNOSIS — E559 Vitamin D deficiency, unspecified: Secondary | ICD-10-CM | POA: Diagnosis not present

## 2019-07-12 DIAGNOSIS — R140 Abdominal distension (gaseous): Secondary | ICD-10-CM | POA: Diagnosis not present

## 2019-07-12 DIAGNOSIS — Z131 Encounter for screening for diabetes mellitus: Secondary | ICD-10-CM | POA: Diagnosis not present

## 2019-07-12 DIAGNOSIS — K219 Gastro-esophageal reflux disease without esophagitis: Secondary | ICD-10-CM | POA: Diagnosis not present

## 2019-07-12 DIAGNOSIS — I10 Essential (primary) hypertension: Secondary | ICD-10-CM | POA: Diagnosis not present

## 2019-07-12 DIAGNOSIS — Z720 Tobacco use: Secondary | ICD-10-CM | POA: Diagnosis not present

## 2019-08-18 DIAGNOSIS — I10 Essential (primary) hypertension: Secondary | ICD-10-CM | POA: Diagnosis not present

## 2019-08-18 DIAGNOSIS — Z0000 Encounter for general adult medical examination without abnormal findings: Secondary | ICD-10-CM | POA: Diagnosis not present

## 2019-08-18 DIAGNOSIS — R7303 Prediabetes: Secondary | ICD-10-CM | POA: Diagnosis not present

## 2019-08-18 DIAGNOSIS — R140 Abdominal distension (gaseous): Secondary | ICD-10-CM | POA: Diagnosis not present

## 2019-08-18 DIAGNOSIS — Z5181 Encounter for therapeutic drug level monitoring: Secondary | ICD-10-CM | POA: Diagnosis not present

## 2019-09-01 DIAGNOSIS — R252 Cramp and spasm: Secondary | ICD-10-CM | POA: Diagnosis not present

## 2019-09-06 DIAGNOSIS — H65191 Other acute nonsuppurative otitis media, right ear: Secondary | ICD-10-CM | POA: Diagnosis not present

## 2019-09-06 DIAGNOSIS — R252 Cramp and spasm: Secondary | ICD-10-CM | POA: Diagnosis not present

## 2019-09-26 DIAGNOSIS — M7989 Other specified soft tissue disorders: Secondary | ICD-10-CM | POA: Diagnosis not present

## 2019-09-26 DIAGNOSIS — R799 Abnormal finding of blood chemistry, unspecified: Secondary | ICD-10-CM | POA: Diagnosis not present

## 2019-09-26 DIAGNOSIS — R252 Cramp and spasm: Secondary | ICD-10-CM | POA: Diagnosis not present

## 2019-10-11 DIAGNOSIS — L089 Local infection of the skin and subcutaneous tissue, unspecified: Secondary | ICD-10-CM | POA: Diagnosis not present

## 2019-12-22 DIAGNOSIS — Z5181 Encounter for therapeutic drug level monitoring: Secondary | ICD-10-CM | POA: Diagnosis not present

## 2019-12-22 DIAGNOSIS — M25462 Effusion, left knee: Secondary | ICD-10-CM | POA: Diagnosis not present

## 2019-12-22 DIAGNOSIS — S96291A Other specified injury of intrinsic muscle and tendon at ankle and foot level, right foot, initial encounter: Secondary | ICD-10-CM | POA: Diagnosis not present

## 2019-12-22 DIAGNOSIS — R252 Cramp and spasm: Secondary | ICD-10-CM | POA: Diagnosis not present

## 2020-01-04 DIAGNOSIS — K8020 Calculus of gallbladder without cholecystitis without obstruction: Secondary | ICD-10-CM | POA: Diagnosis not present

## 2020-01-16 DIAGNOSIS — J449 Chronic obstructive pulmonary disease, unspecified: Secondary | ICD-10-CM | POA: Diagnosis not present

## 2020-01-16 DIAGNOSIS — R0602 Shortness of breath: Secondary | ICD-10-CM | POA: Diagnosis not present

## 2020-01-27 DIAGNOSIS — M25462 Effusion, left knee: Secondary | ICD-10-CM | POA: Diagnosis not present

## 2020-01-27 DIAGNOSIS — M25562 Pain in left knee: Secondary | ICD-10-CM | POA: Diagnosis not present

## 2020-02-16 DIAGNOSIS — J449 Chronic obstructive pulmonary disease, unspecified: Secondary | ICD-10-CM | POA: Diagnosis not present

## 2020-02-16 DIAGNOSIS — R0602 Shortness of breath: Secondary | ICD-10-CM | POA: Diagnosis not present

## 2020-03-01 DIAGNOSIS — M660 Rupture of popliteal cyst: Secondary | ICD-10-CM | POA: Diagnosis not present

## 2020-03-01 DIAGNOSIS — E876 Hypokalemia: Secondary | ICD-10-CM | POA: Diagnosis not present

## 2020-03-01 DIAGNOSIS — I10 Essential (primary) hypertension: Secondary | ICD-10-CM | POA: Diagnosis not present

## 2020-03-05 DIAGNOSIS — R21 Rash and other nonspecific skin eruption: Secondary | ICD-10-CM | POA: Diagnosis not present

## 2020-03-05 DIAGNOSIS — K219 Gastro-esophageal reflux disease without esophagitis: Secondary | ICD-10-CM | POA: Diagnosis not present

## 2020-03-05 DIAGNOSIS — Z720 Tobacco use: Secondary | ICD-10-CM | POA: Diagnosis not present

## 2020-03-05 DIAGNOSIS — E559 Vitamin D deficiency, unspecified: Secondary | ICD-10-CM | POA: Diagnosis not present

## 2020-03-05 DIAGNOSIS — I10 Essential (primary) hypertension: Secondary | ICD-10-CM | POA: Diagnosis not present

## 2020-03-17 DIAGNOSIS — J449 Chronic obstructive pulmonary disease, unspecified: Secondary | ICD-10-CM | POA: Diagnosis not present

## 2020-03-17 DIAGNOSIS — R0602 Shortness of breath: Secondary | ICD-10-CM | POA: Diagnosis not present

## 2020-10-15 DIAGNOSIS — R0602 Shortness of breath: Secondary | ICD-10-CM | POA: Diagnosis not present

## 2020-10-15 DIAGNOSIS — J449 Chronic obstructive pulmonary disease, unspecified: Secondary | ICD-10-CM | POA: Diagnosis not present

## 2020-11-15 DIAGNOSIS — J449 Chronic obstructive pulmonary disease, unspecified: Secondary | ICD-10-CM | POA: Diagnosis not present

## 2020-11-15 DIAGNOSIS — R0602 Shortness of breath: Secondary | ICD-10-CM | POA: Diagnosis not present

## 2020-12-11 DIAGNOSIS — J449 Chronic obstructive pulmonary disease, unspecified: Secondary | ICD-10-CM | POA: Diagnosis not present

## 2020-12-11 DIAGNOSIS — Z0000 Encounter for general adult medical examination without abnormal findings: Secondary | ICD-10-CM | POA: Diagnosis not present

## 2020-12-11 DIAGNOSIS — Z008 Encounter for other general examination: Secondary | ICD-10-CM | POA: Diagnosis not present

## 2020-12-11 DIAGNOSIS — R7303 Prediabetes: Secondary | ICD-10-CM | POA: Diagnosis not present

## 2020-12-11 DIAGNOSIS — I10 Essential (primary) hypertension: Secondary | ICD-10-CM | POA: Diagnosis not present

## 2020-12-11 DIAGNOSIS — L259 Unspecified contact dermatitis, unspecified cause: Secondary | ICD-10-CM | POA: Diagnosis not present

## 2020-12-11 DIAGNOSIS — G4730 Sleep apnea, unspecified: Secondary | ICD-10-CM | POA: Diagnosis not present
